# Patient Record
Sex: Male | Born: 1975 | Race: Black or African American | Hispanic: No | Marital: Married | Smoking: Never smoker
Health system: Southern US, Community
[De-identification: ages and names within clinical notes are randomized; demographics above are authoritative.]

---

## 2014-04-17 ENCOUNTER — Ambulatory Visit (INDEPENDENT_AMBULATORY_CARE_PROVIDER_SITE_OTHER): Payer: Self-pay | Admitting: Family Medicine

## 2014-04-17 VITALS — BP 121/78 | HR 68 | Temp 98.6°F | Resp 16 | Ht 67.0 in | Wt 212.8 lb

## 2014-04-17 DIAGNOSIS — Z0289 Encounter for other administrative examinations: Secondary | ICD-10-CM

## 2014-04-17 NOTE — Progress Notes (Signed)
Commercial Driver Medical Examination   Isaac Gutierrez. is a 38 y.o. male who presents today for a commercial driver fitness determination physical exam. The patient reports no problems. The following portions of the patient's history were reviewed and updated as appropriate: allergies, current medications, past family history, past medical history, past social history, past surgical history and problem list.  Review of Systems Pertinent items are noted in HPI.   Objective:    Vision:  Uncorrected Corrected Horizontal Field of Vision  Right Eye 20/20 na 85 degrees  Left Eye  20/20 na 85 degrees  Both Eyes  39/67 28/97    Applicant can recognize and distinguish among traffic control signals and devices showing standard red, green, and amber colors.     Monocular Vision?: No   Hearing:   500 Hz 1000 Hz 2000 Hz 4000 Hz  Right Ear  N/A N/A N/A N/A  Left Ear  N/A N/A N/A N/A   Can hear whispered voice 10 feet bilaterally.     BP 121/78  Pulse 68  Temp(Src) 98.6 F (37 C) (Oral)  Resp 16  Ht 5' 7"  (1.702 m)  Wt 212 lb 12.8 oz (96.525 kg)  BMI 33.32 kg/m2  SpO2 100%  General Appearance:    Alert, cooperative, no distress, appears stated age  Head:    Normocephalic, without obvious abnormality, atraumatic  Eyes:    PERRL, conjunctiva/corneas clear, EOM's intact, fundi    benign, both eyes       Ears:    Normal TM's and external ear canals, both ears  Nose:   Nares normal, septum midline, mucosa normal, no drainage    or sinus tenderness  Throat:   Lips, mucosa, and tongue normal; teeth and gums normal  Neck:   Supple, symmetrical, trachea midline, no adenopathy;       thyroid:  No enlargement/tenderness/nodules; no carotid   bruit or JVD  Back:     Symmetric, no curvature, ROM normal, no CVA tenderness  Lungs:     Clear to auscultation bilaterally, respirations unlabored  Chest wall:    No tenderness or deformity  Heart:    Regular rate and rhythm, S1 and S2  normal, no murmur, rub   or gallop  Abdomen:     Soft, non-tender, bowel sounds active all four quadrants,    no masses, no organomegaly  Genitalia:    Normal male without lesion or discharge  Extremities:   Extremities normal, atraumatic, no cyanosis or edema  Pulses:   2+ and symmetric all extremities  Skin:   Skin color, texture, turgor normal, no rashes or lesions  Lymph nodes:   Cervical, supraclavicular, and axillary nodes normal  Neurologic:   CNII-XII intact. Normal strength, sensation and reflexes      throughout    Labs: No results found for this basename: SPECGRAV, PROTEINUR, BILIRUBINUR, GLUCOSEU      Assessment:    Healthy male exam.  Meets standards in 7 CFR 391.41;  qualifies for 2 year certificate.    Plan:    Medical examiners certificate completed and printed. Return as needed.

## 2014-07-10 ENCOUNTER — Emergency Department (INDEPENDENT_AMBULATORY_CARE_PROVIDER_SITE_OTHER)
Admission: EM | Admit: 2014-07-10 | Discharge: 2014-07-10 | Disposition: A | Discharge status: Home / Self Care | Payer: Self-pay | Source: Home / Self Care | Attending: Family Medicine | Admitting: Family Medicine

## 2014-07-10 ENCOUNTER — Encounter (HOSPITAL_COMMUNITY): Payer: Self-pay | Admitting: Emergency Medicine

## 2014-07-10 DIAGNOSIS — R109 Unspecified abdominal pain: Secondary | ICD-10-CM

## 2014-07-10 NOTE — ED Provider Notes (Signed)
CSN: [id number]     Arrival date & time 07/10/14  1013 History   First MD Initiated Contact with Patient 07/10/14 1031     Chief Complaint  Patient presents with  . Pain   (Consider location/radiation/quality/duration/timing/severity/associated sxs/prior Treatment) HPI Flank pain: Symptoms started two days ago around 9:30pm. Pain located at left flank in a discrete, short, stripe pattern. Tylenol helps, last taken last night. Constant. Unchanged. Burning pain. Associated swelling. History of chicken pox mostly on abdomen and chest. Normal urination. No fever, nausea vomiting.  History reviewed. No pertinent past medical history. History reviewed. No pertinent past surgical history. Family History  Problem Relation Age of Onset  . Alcohol abuse Father   . Diabetes Sister    History  Substance Use Topics  . Smoking status: Never Smoker   . Smokeless tobacco: Not on file  . Alcohol Use: No    Review of Systems  Gastrointestinal: Negative for nausea, vomiting, diarrhea, constipation and blood in stool.  Genitourinary: Positive for flank pain. Negative for dysuria, frequency, hematuria and difficulty urinating.  Skin: Negative for rash.    Allergies  Review of patient's allergies indicates no known allergies.  Home Medications   Prior to Admission medications   Not on File   BP 106/68  Pulse 60  Temp(Src) 98.1 F (36.7 C) (Oral)  Resp 14  SpO2 100% Physical Exam  Constitutional: He is oriented to person, place, and time. He appears well-developed and well-nourished.  Eyes: Conjunctivae are normal.  Pulmonary/Chest: Effort normal.  Abdominal: Soft. Bowel sounds are normal. He exhibits no distension. There is no hepatosplenomegaly. There is no tenderness. There is no rebound and no CVA tenderness.  Neurological: He is alert and oriented to person, place, and time.  Skin: Skin is warm and dry. No rash noted. No erythema.    ED Course  Procedures (including critical care  time) Labs Review Labs Reviewed - No data to display  Imaging Review No results found.   MDM   1. Left flank pain    Possibly early shingles eruption. History and physical not consistent with diverticulitis vs nephrolithiasis vs constipation vs pyelonephritis vs muscle strain. Patient elects to not start antiviral therapy since symptoms are improving. Will continue Tylenol as needed for pain since this treatment is helping. Patient understands and agrees with plan. Stable for discharge home.    Cordelia Poche, MD 07/10/14 [phone]

## 2014-07-10 NOTE — ED Notes (Signed)
Pain on left side of torso/flank.  Not deep pain, reported as just below skin, constant improves with tylenol, but never goes away completely.  No urinary symptoms, last bm this am and normal.  No known injury, no radiation, no variation in skin appearance

## 2014-07-10 NOTE — ED Provider Notes (Signed)
Isaac Gutierrez. is a 38 y.o. male who presents to Urgent Care today for left flank pain. Patient is a 2 day history of burning tingling pain in a dermatomal pattern on his left flank. He denies any rash. No fevers or chills nausea vomiting or diarrhea. No urinary symptoms. He's tried Tylenol or chills. He notes a history of chickenpox as a child.   History reviewed. No pertinent past medical history. History  Substance Use Topics  . Smoking status: Never Smoker   . Smokeless tobacco: Not on file  . Alcohol Use: No   ROS as above Medications: No current facility-administered medications for this encounter.   No current outpatient prescriptions on file.    Exam:  BP 106/68  Pulse 60  Temp(Src) 98.1 F (36.7 C) (Oral)  Resp 14  SpO2 100% Gen: Well NAD HEENT: EOMI,  MMM Lungs: Normal work of breathing. CTABL Heart: RRR no MRG Abd: NABS, Soft. Nondistended, Nontender Exts: Brisk capillary refill, warm and well perfused.   skin: No rash  No results found for this or any previous visit (from the past 24 hour(s)). No results found.  Assessment and Plan: 38 y.o. male with probable early shingles. Discussed options with patient. Offered acyclovir. Patient states that he does not want to take this medication. We'll continue Tylenol for comfort as needed and watchful waiting.  Discussed warning signs or symptoms. Please see discharge instructions. Patient expresses understanding.     Gregor Hams, MD 07/10/14 1053

## 2014-07-10 NOTE — Discharge Instructions (Signed)
This pain may be related to a shingles infection. Please continue to use Tylenol as needed for pain.  Shingles Shingles (herpes zoster) is an infection that is caused by the same virus that causes chickenpox (varicella). The infection causes a painful skin rash and fluid-filled blisters, which eventually break open, crust over, and heal. It may occur in any area of the body, but it usually affects only one side of the body or face. The pain of shingles usually lasts about 1 month. However, some people with shingles may develop long-term (chronic) pain in the affected area of the body. Shingles often occurs many years after the person had chickenpox. It is more common:  In people older than 50 years.  In people with weakened immune systems, such as those with HIV, AIDS, or cancer.  In people taking medicines that weaken the immune system, such as transplant medicines.  In people under great stress. CAUSES  Shingles is caused by the varicella zoster virus (VZV), which also causes chickenpox. After a person is infected with the virus, it can remain in the person's body for years in an inactive state (dormant). To cause shingles, the virus reactivates and breaks out as an infection in a nerve root. The virus can be spread from person to person (contagious) through contact with open blisters of the shingles rash. It will only spread to people who have not had chickenpox. When these people are exposed to the virus, they may develop chickenpox. They will not develop shingles. Once the blisters scab over, the person is no longer contagious and cannot spread the virus to others. SIGNS AND SYMPTOMS  Shingles shows up in stages. The initial symptoms may be pain, itching, and tingling in an area of the skin. This pain is usually described as burning, stabbing, or throbbing.In a few days or weeks, a painful red rash will appear in the area where the pain, itching, and tingling were felt. The rash is usually on  one side of the body in a band or belt-like pattern. Then, the rash usually turns into fluid-filled blisters. They will scab over and dry up in approximately 2-3 weeks. Flu-like symptoms may also occur with the initial symptoms, the rash, or the blisters. These may include:  Fever.  Chills.  Headache.  Upset stomach. DIAGNOSIS  Your health care provider will perform a skin exam to diagnose shingles. Skin scrapings or fluid samples may also be taken from the blisters. This sample will be examined under a microscope or sent to a lab for further testing. TREATMENT  There is no specific cure for shingles. Your health care provider will likely prescribe medicines to help you manage the pain, recover faster, and avoid long-term problems. This may include antiviral drugs, anti-inflammatory drugs, and pain medicines. HOME CARE INSTRUCTIONS   Take a cool bath or apply cool compresses to the area of the rash or blisters as directed. This may help with the pain and itching.   Take medicines only as directed by your health care provider.   Rest as directed by your health care provider.  Keep your rash and blisters clean with mild soap and cool water or as directed by your health care provider.  Do not pick your blisters or scratch your rash. Apply an anti-itch cream or numbing creams to the affected area as directed by your health care provider.  Keep your shingles rash covered with a loose bandage (dressing).  Avoid skin contact with:  Babies.   Pregnant women.  Children with eczema.   Elderly people with transplants.   People with chronic illnesses, such as leukemia or AIDS.   Wear loose-fitting clothing to help ease the pain of material rubbing against the rash.  Keep all follow-up visits as directed by your health care provider.If the area involved is on your face, you may receive a referral for a specialist, such as an eye doctor (ophthalmologist) or an ear, nose, and  throat (ENT) doctor. Keeping all follow-up visits will help you avoid eye problems, chronic pain, or disability.  SEEK IMMEDIATE MEDICAL CARE IF:   You have facial pain, pain around the eye area, or loss of feeling on one side of your face.  You have ear pain or ringing in your ear.  You have loss of taste.  Your pain is not relieved with prescribed medicines.   Your redness or swelling spreads.   You have more pain and swelling.  Your condition is worsening or has changed.   You have a fever. MAKE SURE YOU:  Understand these instructions.  Will watch your condition.  Will get help right away if you are not doing well or get worse. Document Released: 10/03/2005 Document Revised: 02/17/2014 Document Reviewed: 05/17/2012 [facility] Patient Information 2015 [place], [place]. This information is not intended to replace advice given to you by your health care provider. Make sure you discuss any questions you have with your health care provider.

## 2014-07-12 NOTE — ED Provider Notes (Signed)
Medical screening examination/treatment/procedure(s) were performed by a resident physician or non-physician practitioner and as the supervising physician I was immediately available for consultation/collaboration.  Lynne Leader, MD    Gregor Hams, MD 07/12/14 [phone]

## 2020-10-21 ENCOUNTER — Emergency Department (HOSPITAL_COMMUNITY): Payer: Self-pay

## 2020-10-21 ENCOUNTER — Other Ambulatory Visit: Payer: Self-pay

## 2020-10-21 ENCOUNTER — Emergency Department (HOSPITAL_COMMUNITY)
Admission: EM | Admit: 2020-10-21 | Discharge: 2020-10-22 | Disposition: A | Discharge status: Home / Self Care | Payer: Self-pay | Attending: Emergency Medicine | Admitting: Emergency Medicine

## 2020-10-21 ENCOUNTER — Encounter (HOSPITAL_COMMUNITY): Payer: Self-pay | Admitting: Emergency Medicine

## 2020-10-21 DIAGNOSIS — G510 Bell's palsy: Secondary | ICD-10-CM | POA: Insufficient documentation

## 2020-10-21 LAB — DIFFERENTIAL
Abs Immature Granulocytes: 0.01 10*3/uL (ref 0.00–0.07)
Basophils Absolute: 0 10*3/uL (ref 0.0–0.1)
Basophils Relative: 1 %
Eosinophils Absolute: 0.1 10*3/uL (ref 0.0–0.5)
Eosinophils Relative: 2 %
Immature Granulocytes: 0 %
Lymphocytes Relative: 48 %
Lymphs Abs: 2.2 10*3/uL (ref 0.7–4.0)
Monocytes Absolute: 0.4 10*3/uL (ref 0.1–1.0)
Monocytes Relative: 9 %
Neutro Abs: 1.8 10*3/uL (ref 1.7–7.7)
Neutrophils Relative %: 40 %

## 2020-10-21 LAB — CBC
HCT: 46.2 % (ref 39.0–52.0)
Hemoglobin: 15 g/dL (ref 13.0–17.0)
MCH: 30.5 pg (ref 26.0–34.0)
MCHC: 32.5 g/dL (ref 30.0–36.0)
MCV: 93.9 fL (ref 80.0–100.0)
Platelets: 269 10*3/uL (ref 150–400)
RBC: 4.92 MIL/uL (ref 4.22–5.81)
RDW: 11.5 % (ref 11.5–15.5)
WBC: 4.5 10*3/uL (ref 4.0–10.5)
nRBC: 0 % (ref 0.0–0.2)

## 2020-10-21 LAB — I-STAT CHEM 8, ED
BUN: 14 mg/dL (ref 6–20)
Calcium, Ion: 1.19 mmol/L (ref 1.15–1.40)
Chloride: 101 mmol/L (ref 98–111)
Creatinine, Ser: 1 mg/dL (ref 0.61–1.24)
Glucose, Bld: 108 mg/dL — ABNORMAL HIGH (ref 70–99)
HCT: 46 % (ref 39.0–52.0)
Hemoglobin: 15.6 g/dL (ref 13.0–17.0)
Potassium: 4 mmol/L (ref 3.5–5.1)
Sodium: 141 mmol/L (ref 135–145)
TCO2: 29 mmol/L (ref 22–32)

## 2020-10-21 LAB — COMPREHENSIVE METABOLIC PANEL
ALT: 32 U/L (ref 0–44)
AST: 39 U/L (ref 15–41)
Albumin: 4.3 g/dL (ref 3.5–5.0)
Alkaline Phosphatase: 53 U/L (ref 38–126)
Anion gap: 10 (ref 5–15)
BUN: 12 mg/dL (ref 6–20)
CO2: 27 mmol/L (ref 22–32)
Calcium: 9.6 mg/dL (ref 8.9–10.3)
Chloride: 102 mmol/L (ref 98–111)
Creatinine, Ser: 1.1 mg/dL (ref 0.61–1.24)
GFR, Estimated: >60 mL/min (ref >60)
Glucose, Bld: 110 mg/dL — ABNORMAL HIGH (ref 70–99)
Potassium: 3.9 mmol/L (ref 3.5–5.1)
Sodium: 139 mmol/L (ref 135–145)
Total Bilirubin: 1.7 mg/dL — ABNORMAL HIGH (ref 0.3–1.2)
Total Protein: 7.9 g/dL (ref 6.5–8.1)

## 2020-10-21 LAB — PROTIME-INR
INR: 1 (ref 0.8–1.2)
Prothrombin Time: 12.7 seconds (ref 11.4–15.2)

## 2020-10-21 LAB — APTT: aPTT: 39 seconds — ABNORMAL HIGH (ref 24–36)

## 2020-10-21 LAB — CBG MONITORING, ED: Glucose-Capillary: 105 mg/dL — ABNORMAL HIGH (ref 70–99)

## 2020-10-21 MED ORDER — SODIUM CHLORIDE 0.9% FLUSH: 3.0000 mL | Freq: Once | INTRAVENOUS | Status: DC

## 2020-10-21 NOTE — ED Triage Notes (Signed)
Pt arrives to ED with a c/c of left sided facial droop and a sensation of having no control over the left side of his face since yesterday afternoon. He did have bell palsy years ago that presented similar. Pt has no other weakness.

## 2020-10-22 MED ORDER — VALACYCLOVIR HCL 1 G PO TABS: 1000.0000 mg | ORAL_TABLET | Freq: Three times a day (TID) | ORAL | 0 refills | Status: AC

## 2020-10-22 MED ORDER — PREDNISONE 20 MG PO TABS: 60.0000 mg | ORAL_TABLET | Freq: Every day | ORAL | 0 refills | Status: AC

## 2020-10-22 NOTE — ED Provider Notes (Signed)
[facility] Emergency Department Provider Note MRN:  [id number]  Arrival date & time: 10/22/20     Chief Complaint   Facial Droop   History of Present Illness   Isaac Gutierrez. is a 45 y.o. year-old male with history of Bell's palsy presenting to the ED with chief complaint of facial weakness.  Facial weakness on the left leg began Tuesday evening at 7 PM.  First seem to involve the periorbital region and then spread to the lower part of the left side of the face.  Denies any numbness or weakness to the arms or legs, no speech disturbance, no headache or vision change, no other complaints.  Review of Systems  A complete 10 system review of systems was obtained and all systems are negative except as noted in the HPI and PMH.   Patient's Health History   History reviewed. No pertinent past medical history.  No past surgical history on file.  Family History  Problem Relation Age of Onset  . Alcohol abuse Father   . Diabetes Sister     Social History   Socioeconomic History  . Marital status: Married    Spouse name: Not on file  . Number of children: Not on file  . Years of education: Not on file  . Highest education level: Not on file  Occupational History  . Not on file  Tobacco Use  . Smoking status: Never Smoker  . Smokeless tobacco: Not on file  Substance and Sexual Activity  . Alcohol use: No  . Drug use: No  . Sexual activity: Not on file  Other Topics Concern  . Not on file  Social History Narrative  . Not on file   Social Determinants of Health   Financial Resource Strain: Not on file  Food Insecurity: Not on file  Transportation Needs: Not on file  Physical Activity: Not on file  Stress: Not on file  Social Connections: Not on file  Intimate Partner Violence: Not on file     Physical Exam   Vitals:   10/22/20 0058 10/22/20 0347  BP: 126/77 114/64  Pulse: 78 80  Resp: 18 16  Temp: 98.6 F (37 C)   SpO2: 98% 98%     CONSTITUTIONAL: Well-appearing, NAD NEURO:  Alert and oriented x 3, left-sided facial paralysis involving the periorbital region and forehead EYES:  eyes equal and reactive ENT/NECK:  no LAD, no JVD CARDIO: Regular rate, well-perfused, normal S1 and S2 PULM:  CTAB no wheezing or rhonchi GI/GU:  normal bowel sounds, non-distended, non-tender MSK/SPINE:  No gross deformities, no edema SKIN:  no rash, atraumatic PSYCH:  Appropriate speech and behavior  *Additional and/or pertinent findings included in MDM below  Diagnostic and Interventional Summary    EKG Interpretation  Date/Time:    Ventricular Rate:    PR Interval:    QRS Duration:   QT Interval:    QTC Calculation:   R Axis:     Text Interpretation:        Labs Reviewed  APTT - Abnormal; Notable for the following components:      Result Value   aPTT 39 (*)    All other components within normal limits  COMPREHENSIVE METABOLIC PANEL - Abnormal; Notable for the following components:   Glucose, Bld 110 (*)    Total Bilirubin 1.7 (*)    All other components within normal limits  I-STAT CHEM 8, ED - Abnormal; Notable for the following components:   Glucose, Bld  108 (*)    All other components within normal limits  CBG MONITORING, ED - Abnormal; Notable for the following components:   Glucose-Capillary 105 (*)    All other components within normal limits  PROTIME-INR  CBC  DIFFERENTIAL    CT HEAD WO CONTRAST  Final Result      Medications  sodium chloride flush (NS) 0.9 % injection 3 mL (has no administration in time range)     Procedures  /  Critical Care Procedures  ED Course and Medical Decision Making  I have reviewed the triage vital signs, the nursing notes, and pertinent available records from the EMR.  Listed above are laboratory and imaging tests that I personally ordered, reviewed, and interpreted and then considered in my medical decision making (see below for details).  Exam consistent with  Bell's palsy, otherwise reassuring neurological exam.  Labs and CT ordered in triage are normal.  Patient is appropriate for discharge.       Isaac Gutierrez, [place] [email or web address]  Final Clinical Impressions(s) / ED Diagnoses     ICD-10-CM   1. Bell's palsy  G51.0     ED Discharge Orders         Ordered    predniSONE (DELTASONE) 20 MG tablet  Daily        10/22/20 0355    valACYclovir (VALTREX) 1000 MG tablet  3 times daily        10/22/20 0355           Discharge Instructions Discussed with and Provided to Patient:     Discharge Instructions     You were evaluated in the Emergency Department and after careful evaluation, we did not find any emergent condition requiring admission or further testing in the hospital.  Your exam/testing today is overall reassuring.  Symptoms seem to be due to Bell's palsy.  Please take prednisone and valacyclovir medications as directed, use an eye patch at night and eyedrops during the day to help protect the eye and keep it lubricated.  Please return to the Emergency Department if you experience any worsening of your condition.   Thank you for allowing [place] to be a part of your care.      Maudie Flakes, MD 10/22/20 [phone]

## 2020-10-22 NOTE — Discharge Instructions (Signed)
You were evaluated in the Emergency Department and after careful evaluation, we did not find any emergent condition requiring admission or further testing in the hospital.  Your exam/testing today is overall reassuring.  Symptoms seem to be due to Bell's palsy.  Please take prednisone and valacyclovir medications as directed, use an eye patch at night and eyedrops during the day to help protect the eye and keep it lubricated.  Please return to the Emergency Department if you experience any worsening of your condition.   Thank you for allowing [place] to be a part of your care.

## 2021-05-22 IMAGING — CT CT HEAD W/O CM
4 series · 17 of 47 positions shown, 19 images · non-contrast
Comparison: None.

CLINICAL DATA: Left-sided facial droop

EXAM:
CT HEAD WITHOUT CONTRAST
TECHNIQUE: Contiguous axial images were obtained from the base of the skull
through the vertex without intravenous contrast.

[Series 3: head wo · axial · 0.46mm/px · z∈[-135,-25]mm · 7 of 30 slices shown, 9 images]
[im 4/30  brain]
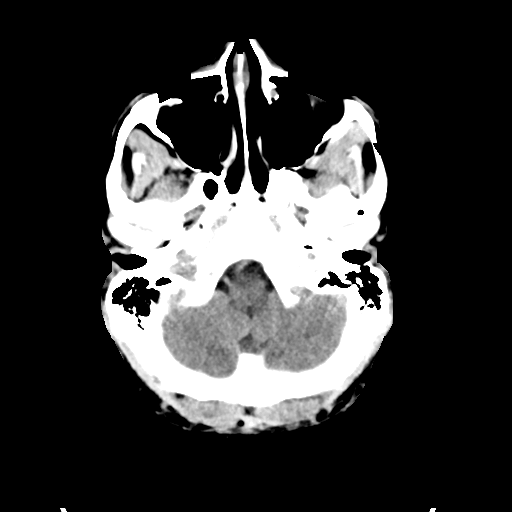
[im 4/30  bone]
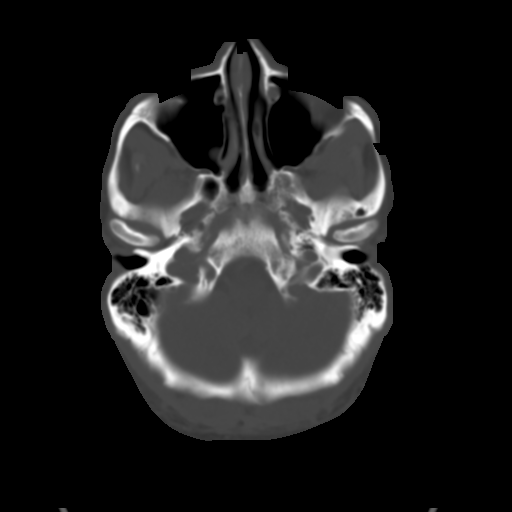
[im 8/30  brain]
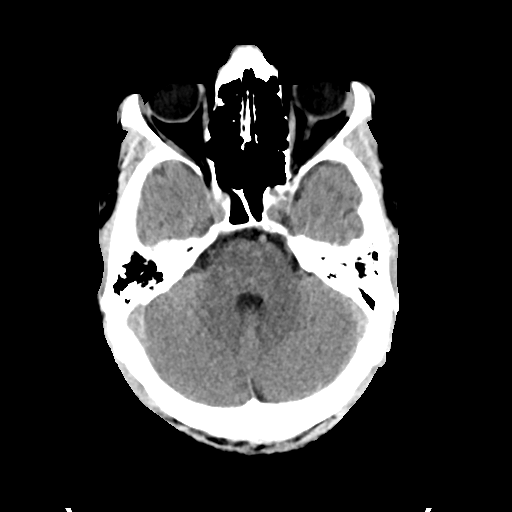
[im 11/30  brain]
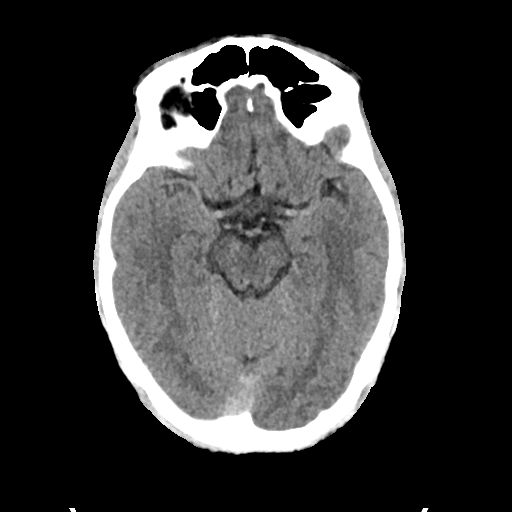
[im 15/30  brain]
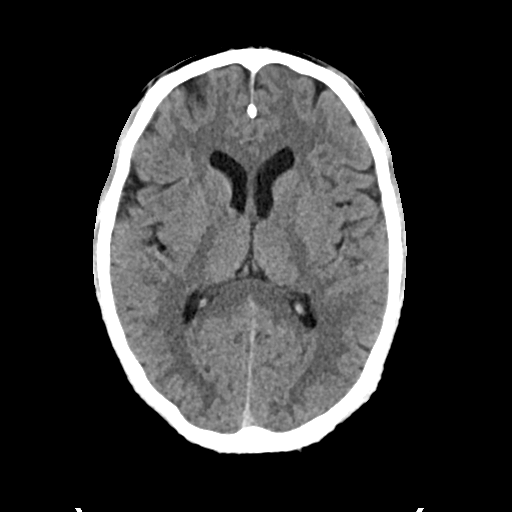
[im 19/30  brain]
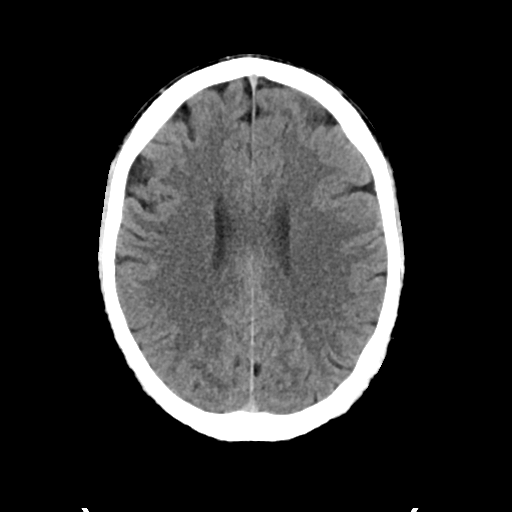
[im 19/30  bone]
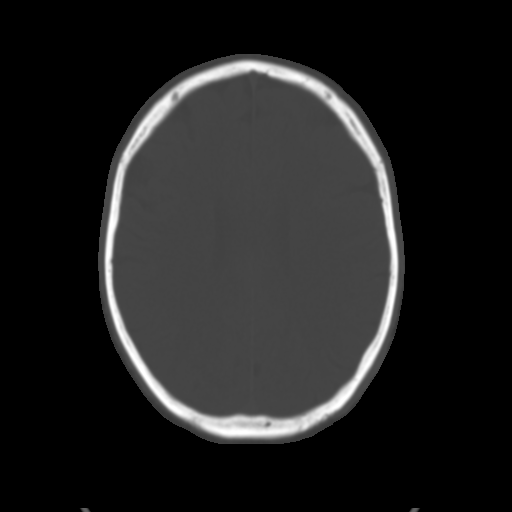
[im 22/30  brain]
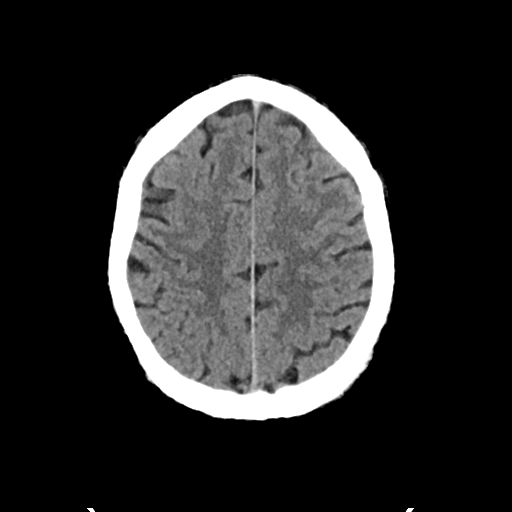
[im 26/30  brain]
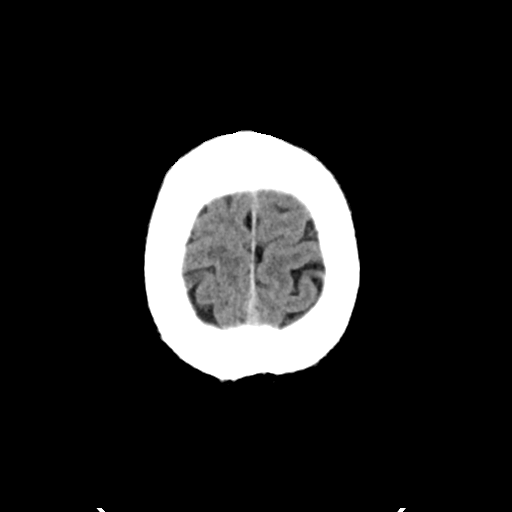

[Series 4: head bone · axial · 0.46mm/px · z∈[-136,-86]mm · 4 of 74 slices shown]
[im 8/74  bone]
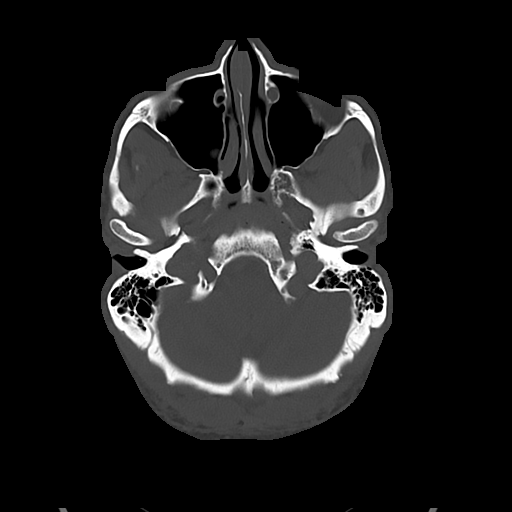
[im 15/74  bone]
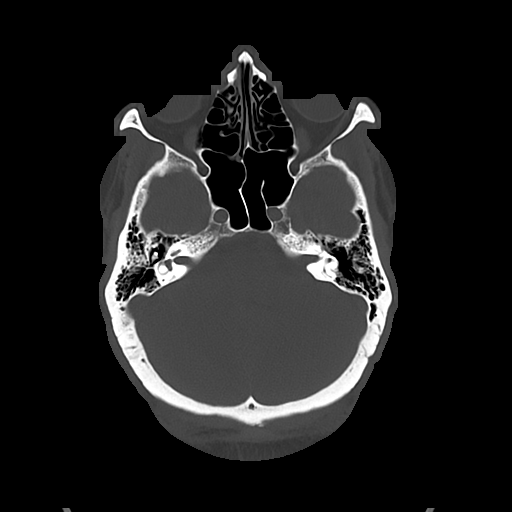
[im 22/74  bone]
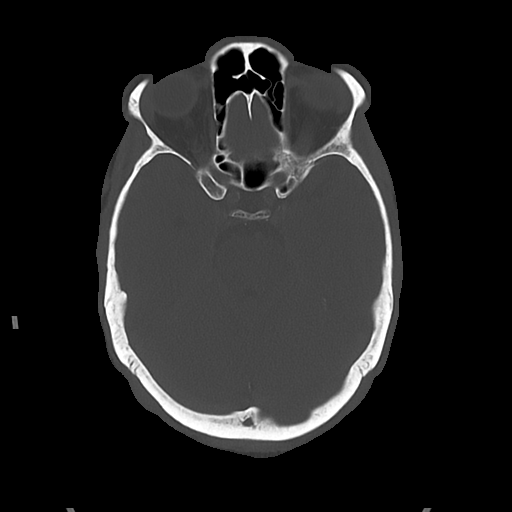
[im 33/74  bone]
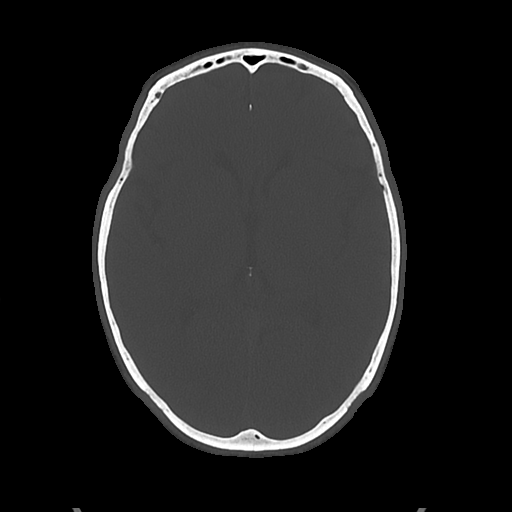

[Series 5: cor soft · coronal · 0.33mm/px · 3 of 71 slices shown]
[im 24/71  brain]
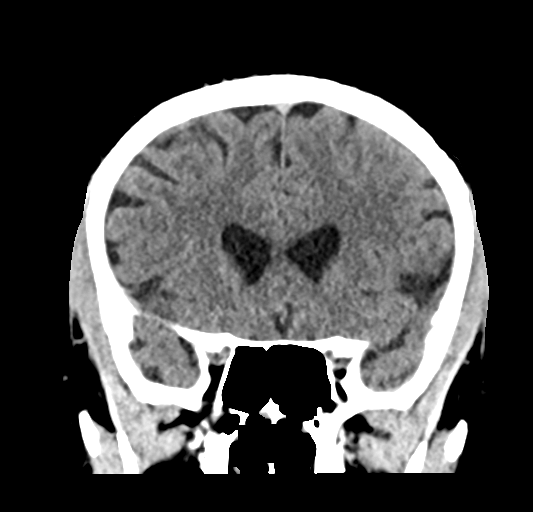
[im 32/71  brain]
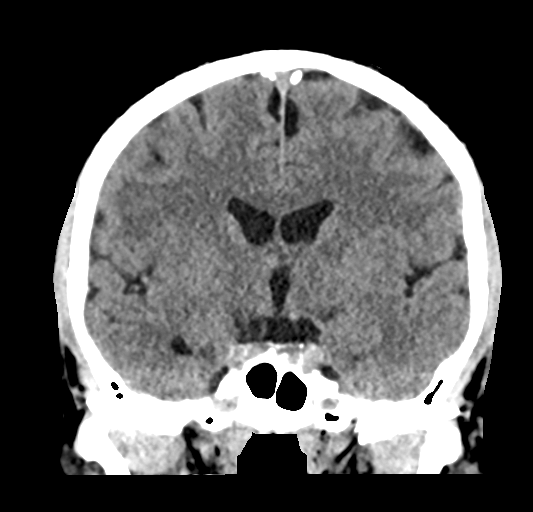
[im 39/71  brain]
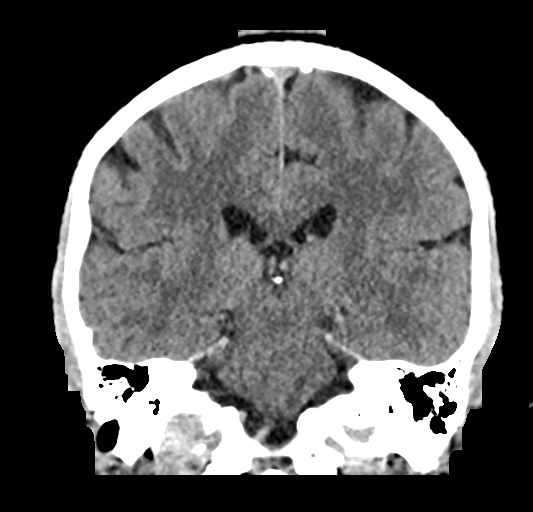

[Series 6: sag soft · sagittal · 0.33mm/px · 3 of 55 slices shown]
[im 19/55  brain]
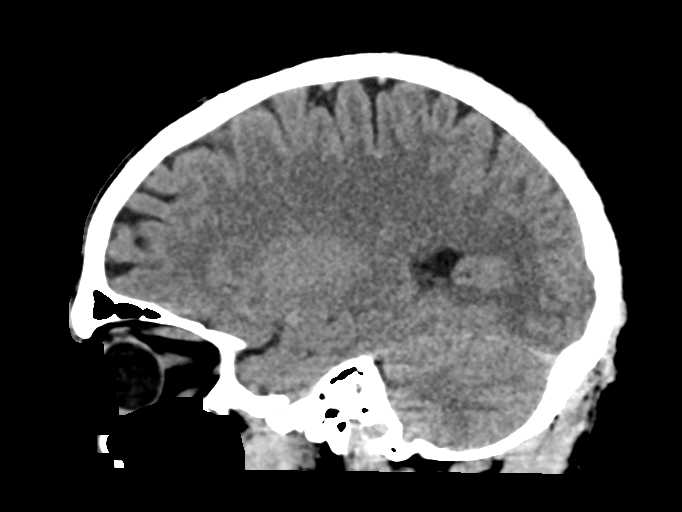
[im 28/55  brain]
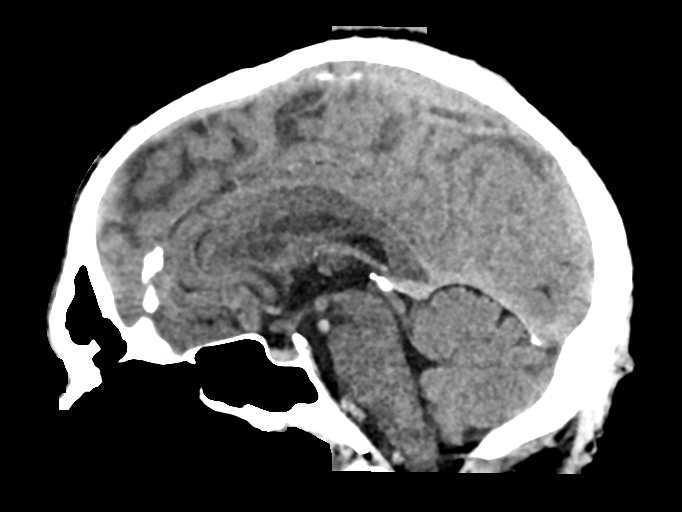
[im 37/55  brain]
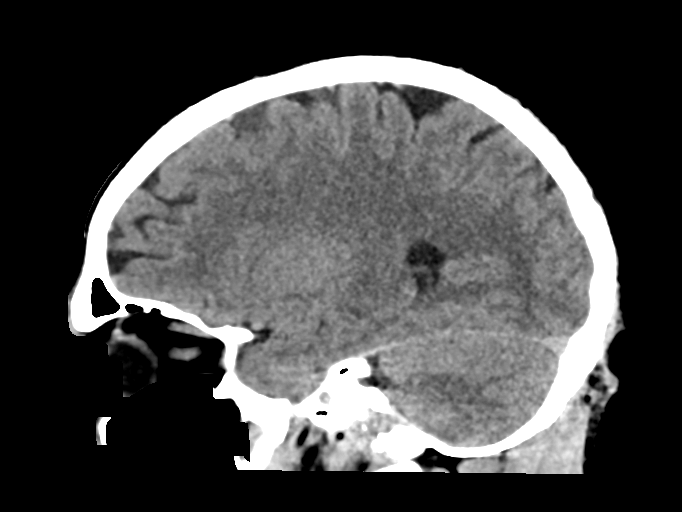

[17 of 47 positions shown; findings below may reference images not displayed]

FINDINGS: Brain: No evidence of acute infarction, hemorrhage, hydrocephalus,
extra-axial collection or mass lesion/mass effect.

Vascular: No hyperdense vessel or unexpected calcification.

Skull: Normal. Negative for fracture or focal lesion.

Sinuses/Orbits: No acute finding.

Other: None.
IMPRESSION: No acute intracranial abnormality noted.

## 2021-08-17 DIAGNOSIS — M25512 Pain in left shoulder: Secondary | ICD-10-CM | POA: Insufficient documentation

## 2021-12-16 DIAGNOSIS — M79646 Pain in unspecified finger(s): Secondary | ICD-10-CM | POA: Insufficient documentation

## 2021-12-24 ENCOUNTER — Other Ambulatory Visit: Payer: Self-pay

## 2021-12-24 ENCOUNTER — Encounter: Payer: Self-pay | Admitting: Family Medicine

## 2021-12-24 ENCOUNTER — Ambulatory Visit (INDEPENDENT_AMBULATORY_CARE_PROVIDER_SITE_OTHER): Payer: Managed Care, Other (non HMO) | Admitting: Family Medicine

## 2021-12-24 VITALS — BP 122/80 | HR 64 | Temp 98.1°F | Resp 16 | Ht 67.0 in | Wt 191.6 lb

## 2021-12-24 DIAGNOSIS — M25512 Pain in left shoulder: Secondary | ICD-10-CM

## 2021-12-24 DIAGNOSIS — M79645 Pain in left finger(s): Secondary | ICD-10-CM

## 2021-12-24 DIAGNOSIS — Z7689 Persons encountering health services in other specified circumstances: Secondary | ICD-10-CM

## 2021-12-24 NOTE — Progress Notes (Signed)
Patient is here to establish care. Patient c/o left shoulder pain and left finger pain. Patient recently was on prednisone and had a cortisone injection for his issues. Per patient the treatment did not work. ?

## 2021-12-24 NOTE — Progress Notes (Signed)
? ?  New Patient Office Visit ? ?Subjective:  ?Patient ID: Isaac Gutierrez., male    DOB: 1976/03/10  Age: 46 y.o. MRN: [id number] ? ?CC:  ?Chief Complaint  ?Patient presents with  ? Establish Care  ? ? ?HPI ?Isaac Gutierrez. presents for to establish. Patient complains of left shoulder and finger pain that he has recently seen ortho for. Patient denies known trauma or injury.  ? ?History reviewed. No pertinent past medical history. ? ?History reviewed. No pertinent surgical history. ? ?Family History  ?Problem Relation Age of Onset  ? Alcohol abuse Father   ? Diabetes Sister   ? ? ?Social History  ? ?Socioeconomic History  ? Marital status: Married  ?  Spouse name: Not on file  ? Number of children: Not on file  ? Years of education: Not on file  ? Highest education level: Not on file  ?Occupational History  ? Not on file  ?Tobacco Use  ? Smoking status: Never  ? Smokeless tobacco: Not on file  ?Vaping Use  ? Vaping Use: Never used  ?Substance and Sexual Activity  ? Alcohol use: No  ? Drug use: No  ? Sexual activity: Yes  ?Other Topics Concern  ? Not on file  ?Social History Narrative  ? Not on file  ? ?Social Determinants of Health  ? ?Financial Resource Strain: Not on file  ?Food Insecurity: Not on file  ?Transportation Needs: Not on file  ?Physical Activity: Not on file  ?Stress: Not on file  ?Social Connections: Not on file  ?Intimate Partner Violence: Not on file  ? ? ?ROS ?Review of Systems  ?All other systems reviewed and are negative. ? ?Objective:  ? ?Today's Vitals: BP 122/80   Pulse 64   Temp 98.1 ?F (36.7 ?C) (Oral)   Resp 16   Ht 5' 7"  (1.702 m)   Wt 191 lb 9.6 oz (86.9 kg)   SpO2 95%   BMI 30.01 kg/m?  ? ?Physical Exam ?Vitals and nursing note reviewed.  ?Constitutional:   ?   General: He is not in acute distress. ?Cardiovascular:  ?   Rate and Rhythm: Normal rate and regular rhythm.  ?Pulmonary:  ?   Effort: Pulmonary effort is normal.  ?   Breath sounds: Normal breath sounds.   ?Musculoskeletal:  ?   Left shoulder: Tenderness present. No swelling or deformity. Decreased range of motion.  ?   Left hand: Tenderness present. No swelling or deformity. Decreased range of motion.  ?Neurological:  ?   General: No focal deficit present.  ?   Mental Status: He is alert and oriented to person, place, and time.  ? ? ?Assessment & Plan:  ? ?1. Pain of left shoulder joint on movement ?Referral to PT for further eval/mgt ? ?2. Pain of finger of left hand ?As above ? ?3. Encounter to establish care ? ? ? ?No outpatient encounter medications on file as of 12/24/2021.  ? ?No facility-administered encounter medications on file as of 12/24/2021.  ? ? ?Follow-up: No follow-ups on file.  ? ?Becky Sax, MD ? ?

## 2022-01-02 NOTE — Therapy (Deleted)
?OUTPATIENT PHYSICAL THERAPY SHOULDER EVALUATION ? ? ?Patient Name: Isaac Gutierrez. ?MRN: [id number] ?DOB:[birth year], 46 y.o., male ?Today's Date: 01/02/2022 ? ? ? ?No past medical history on file. ?No past surgical history on file. ?Patient Active Problem List  ? Diagnosis Date Noted  ? Pain in finger 12/16/2021  ? Pain of left shoulder joint on movement 08/17/2021  ? ? ?PCP: Dorna Mai, MD ? ?REFERRING PROVIDER: Dorna Mai, MD ? ?REFERRING DIAG: M25.512 (ICD-10-CM) - Pain of left shoulder joint on movement M79.645 (ICD-10-CM) - Pain of finger of left hand  ? ?THERAPY DIAG: 1. Pain of left shoulder joint on movement ?Referral to PT for further eval/mgt ?  ?2. Pain of finger of left hand ?As above ?  ? ? ? ?ONSET DATE: 12/24/2021  ? ?SUBJECTIVE:                                                                                                                                                                                     ? ?SUBJECTIVE STATEMENT: ?*** ? ?PERTINENT HISTORY: ?Patient is here to establish care. Patient c/o left shoulder pain and left finger pain. Patient recently was on prednisone and had a cortisone injection for his issues. Per patient the treatment did not work.  ? ?PAIN:  ?Are you having pain? {OPRCPAIN:27236} ? ?PRECAUTIONS: None ? ?WEIGHT BEARING RESTRICTIONS No ? ?FALLS:  ?Has patient fallen in last 6 months? {yes/no:20286} Number of falls: *** ? ?LIVING ENVIRONMENT: ?Lives with: {OPRC lives with:25569::"lives with their family"} ?Lives in: {Lives in:25570} ?Stairs: {yes/no:20286}; {Stairs:24000} ?Has following equipment at home: {Assistive devices:23999} ? ?OCCUPATION: ?*** ? ?PLOF: Independent ? ?PATIENT GOALS *** ? ?OBJECTIVE:  ? ?DIAGNOSTIC FINDINGS:  ?None noted ? ?PATIENT SURVEYS:  ?{rehab surveys:24030:a} ? ?COGNITION: ? Overall cognitive status: Within functional limits for tasks assessed ?    ?SENSATION: ?{sensation:27233} ? ?POSTURE: ?*** ? ?UPPER EXTREMITY ROM:   ? ?{AROM/PROM:27142} ROM Right ?01/02/2022 Left ?01/02/2022  ?Shoulder flexion    ?Shoulder extension    ?Shoulder abduction    ?Shoulder adduction    ?Shoulder internal rotation    ?Shoulder external rotation    ?Elbow flexion    ?Elbow extension    ?Wrist flexion    ?Wrist extension    ?Wrist ulnar deviation    ?Wrist radial deviation    ?Wrist pronation    ?Wrist supination    ?(Blank rows = not tested) ? ?UPPER EXTREMITY MMT: ? ?MMT Right ?01/02/2022 Left ?01/02/2022  ?Shoulder flexion    ?Shoulder extension    ?Shoulder abduction    ?Shoulder adduction    ?Shoulder internal rotation    ?Shoulder external rotation    ?Middle trapezius    ?  Lower trapezius    ?Elbow flexion    ?Elbow extension    ?Wrist flexion    ?Wrist extension    ?Wrist ulnar deviation    ?Wrist radial deviation    ?Wrist pronation    ?Wrist supination    ?Grip strength (lbs)    ?(Blank rows = not tested) ? ?SHOULDER SPECIAL TESTS: ? Impingement tests: {shoulder impingement test:25231:a} ? SLAP lesions: {SLAP lesions:25232} ? Instability tests: {shoulder instability test:25233} ? Rotator cuff assessment: {rotator cuff assessment:25234} ? Biceps assessment: {biceps assessment:25235} ? ?JOINT MOBILITY TESTING:  ?*** ? ?PALPATION:  ?*** ?  ?TODAY'S TREATMENT:  ?*** ? ? ?PATIENT EDUCATION: ?Education details: *** ?Person educated: {Person educated:25204} ?Education method: {Education Method:25205} ?Education comprehension: {Education Comprehension:25206} ? ? ?HOME EXERCISE PROGRAM: ?*** ? ?ASSESSMENT: ? ?CLINICAL IMPRESSION: ?Patient is a *** y.o. *** who was seen today for physical therapy evaluation and treatment for ***.  ? ? ?OBJECTIVE IMPAIRMENTS {opptimpairments:25111}.  ? ?ACTIVITY LIMITATIONS {activity limitations:25113}.  ? ?PERSONAL FACTORS {Personal factors:25162} are also affecting patient's functional outcome.  ? ? ?REHAB POTENTIAL: {rehabpotential:25112} ? ?CLINICAL DECISION MAKING: {clinical decision making:25114} ? ?EVALUATION  COMPLEXITY: {Evaluation complexity:25115} ? ? ?GOALS: ?Goals reviewed with patient? {yes/no:20286} ? ?SHORT TERM GOALS: Target date: {follow up:25551} ? ?*** ?Baseline: *** ?Goal status: {GOALSTATUS:25110} ? ?2.  *** ?Baseline: *** ?Goal status: {GOALSTATUS:25110} ? ?3.  *** ?Baseline: *** ?Goal status: {GOALSTATUS:25110} ? ?4.  *** ?Baseline: *** ?Goal status: {GOALSTATUS:25110} ? ?5.  *** ?Baseline: *** ?Goal status: {GOALSTATUS:25110} ? ?6.  *** ?Baseline: *** ?Goal status: {GOALSTATUS:25110} ? ?LONG TERM GOALS: Target date: {follow up:25551} ? ?*** ?Baseline: *** ?Goal status: {GOALSTATUS:25110} ? ?2.  *** ?Baseline: *** ?Goal status: {GOALSTATUS:25110} ? ?3.  *** ?Baseline: *** ?Goal status: {GOALSTATUS:25110} ? ?4.  *** ?Baseline: *** ?Goal status: {GOALSTATUS:25110} ? ?5.  *** ?Baseline: *** ?Goal status: {GOALSTATUS:25110} ? ?6.  *** ?Baseline: *** ?Goal status: {GOALSTATUS:25110} ? ? ?PLAN: ?PT FREQUENCY: {rehab frequency:25116} ? ?PT DURATION: {rehab duration:25117} ? ?PLANNED INTERVENTIONS: {rehab planned interventions:25118::"Therapeutic exercises","Therapeutic activity","Neuromuscular re-education","Balance training","Gait training","Patient/Family education","Joint mobilization"} ? ?PLAN FOR NEXT SESSION: *** ? ? ?Lanice Shirts, PT ?01/02/2022, 3:53 PM  ?

## 2022-01-03 ENCOUNTER — Ambulatory Visit: Payer: 59

## 2022-01-03 ENCOUNTER — Other Ambulatory Visit: Payer: Self-pay

## 2022-02-04 ENCOUNTER — Encounter: Payer: 59 | Admitting: Family Medicine

## 2022-03-10 ENCOUNTER — Encounter: Payer: Self-pay | Admitting: Family Medicine

## 2022-03-10 ENCOUNTER — Ambulatory Visit (INDEPENDENT_AMBULATORY_CARE_PROVIDER_SITE_OTHER): Payer: Managed Care, Other (non HMO) | Admitting: Family Medicine

## 2022-03-10 VITALS — BP 118/75 | HR 70 | Temp 98.1°F | Resp 16 | Ht 67.0 in | Wt 196.2 lb

## 2022-03-10 DIAGNOSIS — Z0000 Encounter for general adult medical examination without abnormal findings: Secondary | ICD-10-CM | POA: Diagnosis not present

## 2022-03-10 DIAGNOSIS — Z13220 Encounter for screening for lipoid disorders: Secondary | ICD-10-CM

## 2022-03-10 DIAGNOSIS — Z130 Encounter for screening for diseases of the blood and blood-forming organs and certain disorders involving the immune mechanism: Secondary | ICD-10-CM

## 2022-03-10 DIAGNOSIS — Z114 Encounter for screening for human immunodeficiency virus [HIV]: Secondary | ICD-10-CM

## 2022-03-10 DIAGNOSIS — Z1211 Encounter for screening for malignant neoplasm of colon: Secondary | ICD-10-CM

## 2022-03-10 DIAGNOSIS — Z1159 Encounter for screening for other viral diseases: Secondary | ICD-10-CM

## 2022-03-10 NOTE — Progress Notes (Signed)
Established Patient Office Visit  Subjective    Patient ID: Isaac Bastin., male    DOB: 1976-07-23  Age: 46 y.o. MRN: [id number]  CC:  Chief Complaint  Patient presents with   Annual Exam    HPI Isaac Gutierrez. presents for routine annual exam. Patient denies acute complaints or concerns.    No outpatient encounter medications on file as of 03/10/2022.   No facility-administered encounter medications on file as of 03/10/2022.    No past medical history on file.  No past surgical history on file.  Family History  Problem Relation Age of Onset   Alcohol abuse Father    Diabetes Sister     Social History   Socioeconomic History   Marital status: Married    Spouse name: Not on file   Number of children: Not on file   Years of education: Not on file   Highest education level: Not on file  Occupational History   Not on file  Tobacco Use   Smoking status: Never   Smokeless tobacco: Not on file  Vaping Use   Vaping Use: Never used  Substance and Sexual Activity   Alcohol use: No   Drug use: No   Sexual activity: Yes  Other Topics Concern   Not on file  Social History Narrative   Not on file   Social Determinants of Health   Financial Resource Strain: Not on file  Food Insecurity: Not on file  Transportation Needs: Not on file  Physical Activity: Not on file  Stress: Not on file  Social Connections: Not on file  Intimate Partner Violence: Not on file    Review of Systems  All other systems reviewed and are negative.      Objective    BP 118/75   Pulse 70   Temp 98.1 F (36.7 C) (Oral)   Resp 16   Ht 5' 7"  (1.702 m)   Wt 196 lb 3.2 oz (89 kg)   SpO2 95%   BMI 30.73 kg/m   Physical Exam Vitals and nursing note reviewed.  Constitutional:      General: He is not in acute distress. HENT:     Head: Normocephalic and atraumatic.     Right Ear: Tympanic membrane, ear canal and external ear normal.     Left Ear: Tympanic  membrane, ear canal and external ear normal.     Nose: Nose normal.     Mouth/Throat:     Mouth: Mucous membranes are moist.     Pharynx: Oropharynx is clear.  Eyes:     Conjunctiva/sclera: Conjunctivae normal.     Pupils: Pupils are equal, round, and reactive to light.  Neck:     Thyroid: No thyromegaly.  Cardiovascular:     Rate and Rhythm: Normal rate and regular rhythm.     Heart sounds: Normal heart sounds. No murmur heard. Pulmonary:     Effort: Pulmonary effort is normal.     Breath sounds: Normal breath sounds.  Abdominal:     General: There is no distension.     Palpations: Abdomen is soft. There is no mass.     Tenderness: There is no abdominal tenderness.     Hernia: There is no hernia in the left inguinal area or right inguinal area.  Genitourinary:    Penis: Normal and circumcised.      Testes: Normal.  Musculoskeletal:        General: Normal range of motion.  Cervical back: Normal range of motion and neck supple.     Right lower leg: No edema.     Left lower leg: No edema.  Skin:    General: Skin is warm and dry.  Neurological:     General: No focal deficit present.     Mental Status: He is alert and oriented to person, place, and time. Mental status is at baseline.  Psychiatric:        Mood and Affect: Mood normal.        Behavior: Behavior normal.        Assessment & Plan:   1. Annual physical exam Routine labs ordered - CMP14+EGFR - PSA  2 Screening for deficiency anemia  - CBC with Differential  3. Screening for lipid disorders  - Lipid Panel  4. Screening for endocrine/metabolic/immunity disorders  - Hemoglobin A1c - TSH  5. Screening for colon cancer  - Cologuard  6. Screening for HIV (human immunodeficiency virus)  - HIV antibody (with reflex)  7. Need for hepatitis C screening test  - Hepatitis C Antibody    No follow-ups on file.   Becky Sax, MD

## 2022-03-11 LAB — CMP14+EGFR
ALT: 23 IU/L (ref 0–44)
AST: 28 IU/L (ref 0–40)
Albumin/Globulin Ratio: 1.6 (ref 1.2–2.2)
Albumin: 4.6 g/dL (ref 4.0–5.0)
Alkaline Phosphatase: 57 IU/L (ref 44–121)
BUN/Creatinine Ratio: 13 (ref 9–20)
BUN: 13 mg/dL (ref 6–24)
Bilirubin Total: 0.5 mg/dL (ref 0.0–1.2)
CO2: 23 mmol/L (ref 20–29)
Calcium: 9.6 mg/dL (ref 8.7–10.2)
Chloride: 102 mmol/L (ref 96–106)
Creatinine, Ser: 1.02 mg/dL (ref 0.76–1.27)
Globulin, Total: 2.9 g/dL (ref 1.5–4.5)
Glucose: 77 mg/dL (ref 70–99)
Potassium: 4.2 mmol/L (ref 3.5–5.2)
Sodium: 141 mmol/L (ref 134–144)
Total Protein: 7.5 g/dL (ref 6.0–8.5)
eGFR: 91 mL/min/{1.73_m2} (ref >59)

## 2022-03-11 LAB — CBC WITH DIFFERENTIAL/PLATELET
Basophils Absolute: 0 10*3/uL (ref 0.0–0.2)
Basos: 1 %
EOS (ABSOLUTE): 0.1 10*3/uL (ref 0.0–0.4)
Eos: 2 %
Hematocrit: 44.5 % (ref 37.5–51.0)
Hemoglobin: 15.1 g/dL (ref 13.0–17.7)
Immature Grans (Abs): 0 10*3/uL (ref 0.0–0.1)
Immature Granulocytes: 0 %
Lymphocytes Absolute: 2.4 10*3/uL (ref 0.7–3.1)
Lymphs: 49 %
MCH: 31.7 pg (ref 26.6–33.0)
MCHC: 33.9 g/dL (ref 31.5–35.7)
MCV: 94 fL (ref 79–97)
Monocytes Absolute: 0.4 10*3/uL (ref 0.1–0.9)
Monocytes: 7 %
Neutrophils Absolute: 2 10*3/uL (ref 1.4–7.0)
Neutrophils: 41 %
Platelets: 252 10*3/uL (ref 150–450)
RBC: 4.76 x10E6/uL (ref 4.14–5.80)
RDW: 11.5 % — ABNORMAL LOW (ref 11.6–15.4)
WBC: 4.9 10*3/uL (ref 3.4–10.8)

## 2022-03-11 LAB — TSH: TSH: 1.64 u[IU]/mL (ref 0.450–4.500)

## 2022-03-11 LAB — LIPID PANEL
Chol/HDL Ratio: 4.2 ratio (ref 0.0–5.0)
Cholesterol, Total: 225 mg/dL — ABNORMAL HIGH (ref 100–199)
HDL: 53 mg/dL (ref >39)
LDL Chol Calc (NIH): 159 mg/dL — ABNORMAL HIGH (ref 0–99)
Triglycerides: 76 mg/dL (ref 0–149)
VLDL Cholesterol Cal: 13 mg/dL (ref 5–40)

## 2022-03-11 LAB — HIV ANTIBODY (ROUTINE TESTING W REFLEX): HIV Screen 4th Generation wRfx: NONREACTIVE

## 2022-03-11 LAB — PSA: Prostate Specific Ag, Serum: 0.2 ng/mL (ref 0.0–4.0)

## 2022-03-11 LAB — HEPATITIS C ANTIBODY: Hep C Virus Ab: NONREACTIVE

## 2022-03-11 LAB — HEMOGLOBIN A1C
Est. average glucose Bld gHb Est-mCnc: 100 mg/dL
Hgb A1c MFr Bld: 5.1 % (ref 4.8–5.6)

## 2022-03-18 ENCOUNTER — Encounter: Payer: Self-pay | Admitting: Family Medicine

## 2022-03-22 ENCOUNTER — Other Ambulatory Visit: Payer: Self-pay

## 2022-03-22 DIAGNOSIS — R7989 Other specified abnormal findings of blood chemistry: Secondary | ICD-10-CM

## 2022-03-23 ENCOUNTER — Other Ambulatory Visit: Payer: Managed Care, Other (non HMO)

## 2022-03-24 ENCOUNTER — Other Ambulatory Visit: Payer: Self-pay | Admitting: Family

## 2022-03-24 DIAGNOSIS — R7989 Other specified abnormal findings of blood chemistry: Secondary | ICD-10-CM

## 2022-03-24 LAB — SPECIMEN STATUS REPORT

## 2022-03-24 LAB — TESTOSTERONE: Testosterone: 251 ng/dL — ABNORMAL LOW (ref 264–916)

## 2022-03-30 LAB — COLOGUARD: COLOGUARD: NEGATIVE

## 2022-07-07 ENCOUNTER — Encounter: Payer: Self-pay | Admitting: Physician Assistant

## 2022-07-07 ENCOUNTER — Ambulatory Visit (INDEPENDENT_AMBULATORY_CARE_PROVIDER_SITE_OTHER): Payer: Managed Care, Other (non HMO)

## 2022-07-07 ENCOUNTER — Ambulatory Visit: Payer: Managed Care, Other (non HMO) | Admitting: Physician Assistant

## 2022-07-07 VITALS — BP 121/81 | HR 78 | Temp 98.7°F | Resp 16 | Wt 197.0 lb

## 2022-07-07 DIAGNOSIS — M79601 Pain in right arm: Secondary | ICD-10-CM

## 2022-07-07 DIAGNOSIS — M7989 Other specified soft tissue disorders: Secondary | ICD-10-CM | POA: Diagnosis not present

## 2022-07-07 MED ORDER — CYCLOBENZAPRINE HCL 5 MG PO TABS: 5.0000 mg | ORAL_TABLET | Freq: Three times a day (TID) | ORAL | 1 refills | Status: DC | PRN

## 2022-07-07 MED ORDER — KETOROLAC TROMETHAMINE 60 MG/2ML IM SOLN
60.0000 mg | Freq: Once | INTRAMUSCULAR | Status: AC
  Administered 2022-07-07: 60 mg via INTRAMUSCULAR

## 2022-07-07 MED ORDER — PREDNISONE 20 MG PO TABS: ORAL_TABLET | ORAL | 0 refills | Status: DC

## 2022-07-07 NOTE — Progress Notes (Signed)
Patient ID: Isaac Gutierrez., male   DOB: [birth year], 46 y.o.   MRN: [id number]   Isaac Gutierrez, is a 46 y.o. male  WUJ:[id number]  [id number]  DOB - Jul 29, 1976  No chief complaint on file.      Subjective:   Isaac Gutierrez is a 46 y.o. male here today for 2-3 week h/o R forearm pain esp with pronation and supination.  +swelling.  No point tenderness.  Various movements at the forearm increase the pain.  Ibuprofen has not helped.  NKI.  He does a lot of lifting and moving stuff at work, but does not remember any definite injury.  Pain is burning and aching with occasional shotting pains.  No CP/SOB.  No FH or personal h/o blood clot.  No fever.  No tick bite.  No scratches.     No problems updated.  ALLERGIES: No Known Allergies  PAST MEDICAL HISTORY: No past medical history on file.  MEDICATIONS AT HOME: Prior to Admission medications   Medication Sig Start Date End Date Taking? Authorizing Provider  cyclobenzaprine (FLEXERIL) 5 MG tablet Take 1 tablet (5 mg total) by mouth 3 (three) times daily as needed for muscle spasms. 07/07/22  Yes Tedd Cottrill M, PA-C  predniSONE (DELTASONE) 20 MG tablet 3,3,3,2,2,2,1,1,1 take each days dose at once in the morning with food 07/07/22  Yes Rosali Augello M, PA-C    ROS: Neg HEENT Neg resp Neg cardiac Neg GI Neg GU Neg psych Neg neuro  Objective:   Vitals:   07/07/22 1052  BP: 121/81  Pulse: 78  Resp: 16  Temp: 98.7 F (37.1 C)  TempSrc: Oral  SpO2: 97%  Weight: 197 lb (89.4 kg)   Exam General appearance : Awake, alert, not in any distress. Speech Clear. Not toxic looking HEENT: Atraumatic and Normocephalic Neck: Supple, no JVD. No cervical lymphadenopathy.  Chest: Good air entry bilaterally, CTAB.  No rales/rhonchi/wheezing CVS: S1 S2 regular, no murmurs.  Very hesitant with forearm movements.  R compared to L-there is mild swelling from about mid forearm and up to [facility] fossa.  There is no point  tenderness of effusion of the elbow.  I am unable to identify point tenderness.  ROM reduced to about 80% with pronation, supination and flexion. Grip strength normal B Extremities: B/L Lower Ext shows no edema, both legs are warm to touch Neurology: Awake alert, and oriented X 3, CN II-XII intact, Non focal Skin: No Rash  Data Review Lab Results  Component Value Date   HGBA1C 5.1 03/10/2022    Assessment & Plan   1. Right arm pain Uncertain etiology.  81mg  aspirin given po in office and advised to take one tomorrow morning as well - ketorolac (TORADOL) injection 60 mg - Uric Acid - CBC with Differential/Platelet - Comprehensive metabolic panel - DG Elbow Complete Right; Future - DG Forearm Right; Future - predniSONE (DELTASONE) 20 MG tablet; 3,3,3,2,2,2,1,1,1 take each days dose at once in the morning with food  Dispense: 18 tablet; Refill: 0 - cyclobenzaprine (FLEXERIL) 5 MG tablet; Take 1 tablet (5 mg total) by mouth 3 (three) times daily as needed for muscle spasms.  Dispense: 30 tablet; Refill: 1 - Ambulatory referral to Orthopedic Surgery - US Venous Img Lower Unilateral Right (DVT); Future  2. Arm swelling See #1 - DG Elbow Complete Right; Future - DG Forearm Right; Future - predniSONE (DELTASONE) 20 MG tablet; 3,3,3,2,2,2,1,1,1 take each days dose at once in the morning with food  Dispense: 18 tablet; Refill:  0 - cyclobenzaprine (FLEXERIL) 5 MG tablet; Take 1 tablet (5 mg total) by mouth 3 (three) times daily as needed for muscle spasms.  Dispense: 30 tablet; Refill: 1 - Ambulatory referral to Orthopedic Surgery - US Venous Img Lower Unilateral Right (DVT); Future    F/up after studies  The patient was given clear instructions to go to ER or return to medical center if symptoms don't improve, worsen or new problems develop. The patient verbalized understanding. The patient was told to call to get lab results if they haven't heard anything in the next week.       Georgian Co, PA-C [facility] [place], [place] [phone]   07/07/2022, 12:00 PM

## 2022-07-07 NOTE — Progress Notes (Signed)
Patient

## 2022-07-08 ENCOUNTER — Ambulatory Visit
Admission: RE | Admit: 2022-07-08 | Discharge: 2022-07-08 | Disposition: A | Discharge status: Home / Self Care | Payer: Managed Care, Other (non HMO) | Source: Ambulatory Visit | Attending: Physician Assistant | Admitting: Physician Assistant

## 2022-07-08 ENCOUNTER — Ambulatory Visit (INDEPENDENT_AMBULATORY_CARE_PROVIDER_SITE_OTHER): Payer: Managed Care, Other (non HMO) | Admitting: Orthopaedic Surgery

## 2022-07-08 ENCOUNTER — Ambulatory Visit: Payer: Managed Care, Other (non HMO) | Admitting: Family Medicine

## 2022-07-08 DIAGNOSIS — M79601 Pain in right arm: Secondary | ICD-10-CM | POA: Diagnosis not present

## 2022-07-08 DIAGNOSIS — M7989 Other specified soft tissue disorders: Secondary | ICD-10-CM

## 2022-07-08 LAB — CBC WITH DIFFERENTIAL/PLATELET
Basophils Absolute: 0.1 10*3/uL (ref 0.0–0.2)
Basos: 1 %
EOS (ABSOLUTE): 0.1 10*3/uL (ref 0.0–0.4)
Eos: 3 %
Hematocrit: 43.6 % (ref 37.5–51.0)
Hemoglobin: 14.3 g/dL (ref 13.0–17.7)
Immature Grans (Abs): 0 10*3/uL (ref 0.0–0.1)
Immature Granulocytes: 0 %
Lymphocytes Absolute: 2.3 10*3/uL (ref 0.7–3.1)
Lymphs: 48 %
MCH: 31.2 pg (ref 26.6–33.0)
MCHC: 32.8 g/dL (ref 31.5–35.7)
MCV: 95 fL (ref 79–97)
Monocytes Absolute: 0.4 10*3/uL (ref 0.1–0.9)
Monocytes: 8 %
Neutrophils Absolute: 1.9 10*3/uL (ref 1.4–7.0)
Neutrophils: 40 %
Platelets: 281 10*3/uL (ref 150–450)
RBC: 4.59 x10E6/uL (ref 4.14–5.80)
RDW: 11.6 % (ref 11.6–15.4)
WBC: 4.7 10*3/uL (ref 3.4–10.8)

## 2022-07-08 LAB — COMPREHENSIVE METABOLIC PANEL
ALT: 28 IU/L (ref 0–44)
AST: 30 IU/L (ref 0–40)
Albumin/Globulin Ratio: 1.8 (ref 1.2–2.2)
Albumin: 4.7 g/dL (ref 4.1–5.1)
Alkaline Phosphatase: 55 IU/L (ref 44–121)
BUN/Creatinine Ratio: 9 (ref 9–20)
BUN: 9 mg/dL (ref 6–24)
Bilirubin Total: 0.6 mg/dL (ref 0.0–1.2)
CO2: 23 mmol/L (ref 20–29)
Calcium: 9.6 mg/dL (ref 8.7–10.2)
Chloride: 100 mmol/L (ref 96–106)
Creatinine, Ser: 0.96 mg/dL (ref 0.76–1.27)
Globulin, Total: 2.6 g/dL (ref 1.5–4.5)
Glucose: 80 mg/dL (ref 70–99)
Potassium: 4.3 mmol/L (ref 3.5–5.2)
Sodium: 140 mmol/L (ref 134–144)
Total Protein: 7.3 g/dL (ref 6.0–8.5)
eGFR: 98 mL/min/{1.73_m2} (ref >59)

## 2022-07-08 LAB — URIC ACID: Uric Acid: 5.7 mg/dL (ref 3.8–8.4)

## 2022-07-08 NOTE — Progress Notes (Signed)
Office Visit Note   Patient: Isaac Gutierrez.           Date of Birth: 1976/01/28           MRN: [id number] Visit Date: 07/08/2022              Requested by: Argentina Donovan, PA-C [address] [place],  [place] [address] PCP: Dorna Mai, MD   Assessment & Plan: Visit Diagnoses:  1. Right arm pain     Plan: Impression is resolving right forearm rhabdomyolysis from overuse of the right upper extremity.  At this point, we are not concerned for compartment syndrome.  Recent CMP obtained from his primary care provider did not show any renal impairment.  We would like to order a creatinine kinase today.  We will have him treat this symptomatically with rest, ice, NSAIDs.  He understands that he needs to refrain from any exercising of his arm or lifting for work until he feels 100%.  He will follow-up with [place] as needed.  He will call with any concerns or questions.  Follow-Up Instructions: Return if symptoms worsen or fail to improve.   Orders:  Orders Placed This Encounter  Procedures   CK (Creatine Kinase)   No orders of the defined types were placed in this encounter.     Procedures: No procedures performed   Clinical Data: No additional findings.   Subjective: Chief Complaint  Patient presents with   Right Arm - Pain    HPI patient is a very pleasant 46 year old who comes in today with right forearm pain for the past 3 to 4 weeks.  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  He denies any specific injury.  His symptoms have been worsening over the past week.  The pain is primarily to the mid to proximal volar aspect of the forearm.  He really has minimal rest pain but does have increased pain with pronation of the forearm as well as slight pain with wrist extension.  He was recently seen by his primary care provider where he was started on a steroid.  This is  helping with his symptoms.  Also of note, CMP was recently drawn which did not show any renal failure.  Review of Systems as detailed in HPI.  All others reviewed and are negative.   Objective: Vital Signs: There were no vitals taken for this visit.  Physical Exam well-developed well-nourished gentleman in no acute distress.  Alert and oriented x3.  Ortho Exam right upper extremity exam reveals minimal and diffuse tenderness throughout the volar and dorsal proximal forearm.  He does have mildly increased pain with pronation and wrist extension.  Minimal tenderness to the lateral epicondyle and radial tunnel.  No tenderness to the medial epicondyle.  No specific tenderness to the extensor or flexor tendons.  Mild swelling.  No tenderness to the first dorsal compartment.  He is neurovascular intact distally.  Specialty Comments:  No specialty comments available.  Imaging: No new imaging   PMFS History: Patient Active Problem List   Diagnosis Date Noted   Pain in finger 12/16/2021   Pain of left shoulder joint on movement 08/17/2021   No past medical history on file.  Family History  Problem Relation Age of Onset   Alcohol abuse Father    Diabetes Sister     No past surgical history on  file. Social History   Occupational History   Not on file  Tobacco Use   Smoking status: Never   Smokeless tobacco: Not on file  Vaping Use   Vaping Use: Never used  Substance and Sexual Activity   Alcohol use: No   Drug use: No   Sexual activity: Yes

## 2022-07-09 LAB — CK: Total CK: 332 U/L — ABNORMAL HIGH (ref 44–196)

## 2022-08-01 ENCOUNTER — Other Ambulatory Visit (HOSPITAL_COMMUNITY): Payer: Self-pay

## 2022-08-01 MED ORDER — CLOMIPHENE CITRATE 50 MG PO TABS
25.0000 mg | ORAL_TABLET | Freq: Every day | ORAL | 3 refills | Status: AC
  Filled 2022-08-01: qty 15, 30d supply, fill #0
  Filled 2022-08-28: qty 15, 30d supply, fill #1
  Filled 2022-11-08 (×2): qty 15, 30d supply, fill #2

## 2022-08-29 ENCOUNTER — Other Ambulatory Visit (HOSPITAL_COMMUNITY): Payer: Self-pay

## 2022-11-08 ENCOUNTER — Encounter: Payer: Self-pay | Admitting: Orthopaedic Surgery

## 2022-11-08 ENCOUNTER — Ambulatory Visit: Payer: Managed Care, Other (non HMO) | Admitting: Orthopaedic Surgery

## 2022-11-08 ENCOUNTER — Other Ambulatory Visit (HOSPITAL_COMMUNITY): Payer: Self-pay

## 2022-11-08 DIAGNOSIS — G5603 Carpal tunnel syndrome, bilateral upper limbs: Secondary | ICD-10-CM

## 2022-11-08 NOTE — Progress Notes (Signed)
   Office Visit Note   Patient: Isaac Gutierrez.           Date of Birth: [birth year]           MRN: [id number] Visit Date: 11/08/2022              Requested by: Dorna Mai, [address] [address],  [place] [address] PCP: Dorna Mai, MD   Assessment & Plan: Visit Diagnoses:  1. Bilateral carpal tunnel syndrome     Plan: Impression is bilateral hand carpal tunnel syndrome left greater than right.  At this point, we have discussed various treatment options to include night splinting as well as obtaining a nerve conduction study/EMG followed by possible cortisone injection versus surgical intervention.  He is interested in trying the nighttime braces and getting a nerve conduction study but is really not interested in any other treatment options at this point.  He will follow-up with [place] once the nerve conduction study has been completed.  Follow-Up Instructions: Return for f/u after ncs.   Orders:  No orders of the defined types were placed in this encounter.  No orders of the defined types were placed in this encounter.     Procedures: No procedures performed   Clinical Data: No additional findings.   Subjective: Chief Complaint  Patient presents with   Right Hand - Numbness   Left Hand - Numbness    HPI patient is a pleasant 47 year old right-hand-dominant gentleman who comes in today with bilateral hand paresthesias left greater than right.  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  He complains of numbness and tingling to all 10 fingers which frequently occurs at night as well as sometimes throughout the day.  He does note improvement in symptoms when he is shaking his hands.  He has not tried nighttime bracing.  No carpal tunnel injections.  Review of Systems as detailed in HPI.  All others reviewed and are negative.   Objective: Vital Signs: There were no vitals taken for this visit.  Physical Exam well-developed  well-nourished gentleman in no acute distress.  Alert and oriented x 3.  Ortho Exam bilateral hand exam shows no thenar atrophy.  Negative Phalen and negative Tinel.  He is neurovascularly intact distally.  Specialty Comments:  No specialty comments available.  Imaging: No new imaging   PMFS History: Patient Active Problem List   Diagnosis Date Noted   Pain in finger 12/16/2021   Pain of left shoulder joint on movement 08/17/2021   History reviewed. No pertinent past medical history.  Family History  Problem Relation Age of Onset   Alcohol abuse Father    Diabetes Sister     History reviewed. No pertinent surgical history. Social History   Occupational History   Not on file  Tobacco Use   Smoking status: Never   Smokeless tobacco: Not on file  Vaping Use   Vaping Use: Never used  Substance and Sexual Activity   Alcohol use: No   Drug use: No   Sexual activity: Yes

## 2022-11-18 ENCOUNTER — Ambulatory Visit (INDEPENDENT_AMBULATORY_CARE_PROVIDER_SITE_OTHER): Payer: Managed Care, Other (non HMO) | Admitting: Physical Medicine and Rehabilitation

## 2022-11-18 DIAGNOSIS — R202 Paresthesia of skin: Secondary | ICD-10-CM

## 2022-11-18 DIAGNOSIS — G8929 Other chronic pain: Secondary | ICD-10-CM

## 2022-11-18 DIAGNOSIS — M79641 Pain in right hand: Secondary | ICD-10-CM | POA: Diagnosis not present

## 2022-11-18 DIAGNOSIS — M25512 Pain in left shoulder: Secondary | ICD-10-CM

## 2022-11-18 DIAGNOSIS — M79642 Pain in left hand: Secondary | ICD-10-CM

## 2022-11-18 DIAGNOSIS — M79601 Pain in right arm: Secondary | ICD-10-CM | POA: Diagnosis not present

## 2022-11-18 DIAGNOSIS — M25511 Pain in right shoulder: Secondary | ICD-10-CM

## 2022-11-18 NOTE — Progress Notes (Signed)
Functional Pain Scale - descriptive words and definitions  No Pain (0)   No Pain/Loss of function  Average Pain 0  Right handed. Numbness and tingling in hands

## 2022-11-23 NOTE — Procedures (Signed)
EMG & NCV Findings: Evaluation of the left median motor and the right median motor nerves showed prolonged distal onset latency (L6.1, R6.1 ms) and decreased conduction velocity (Elbow-Wrist, L46, R49 m/s).  The left median (across palm) sensory and the right median (across palm) sensory nerves showed prolonged distal peak latency (Wrist, L6.7, R6.7 ms), reduced amplitude (L5.3, R4.3 V), and prolonged distal peak latency (Palm, L6.5, R5.9 ms).  All remaining nerves (as indicated in the following tables) were within normal limits.  All left vs. right side differences were within normal limits.    All examined muscles (as indicated in the following table) showed no evidence of electrical instability.    Impression: The above electrodiagnostic study is ABNORMAL and reveals evidence of a moderate to severe bilateral median nerve entrapment at the wrist (carpal tunnel syndrome) affecting sensory and motor components.   There is no significant electrodiagnostic evidence of any other focal nerve entrapment, brachial plexopathy or cervical radiculopathy.   Recommendations: 1.  Follow-up with referring physician. 2.  Continue current management of symptoms. 3.  Suggest surgical evaluation.  ___________________________ Laurence Spates FAAPMR Board Certified, American Board of Physical Medicine and Rehabilitation    Nerve Conduction Studies Anti Sensory Summary Table   Stim Site NR Peak (ms) Norm Peak (ms) P-T Amp (V) Norm P-T Amp Site1 Site2 Delta-P (ms) Dist (cm) Vel (m/s) Norm Vel (m/s)  Left Median Acr Palm Anti Sensory (2nd Digit)  30.3C  Wrist    *6.7 <3.6 *5.3 >10 Wrist Palm 0.2 0.0    Palm    *6.5 <2.0 5.8         Right Median Acr Palm Anti Sensory (2nd Digit)  30.7C  Wrist    *6.7 <3.6 *4.3 >10 Wrist Palm 0.8 0.0    Palm    *5.9 <2.0 2.1         Right Radial Anti Sensory (Base 1st Digit)  30.5C  Wrist    2.1 <3.1 30.8  Wrist Base 1st Digit 2.1 0.0    Right Ulnar Anti Sensory (5th  Digit)  30.8C  Wrist    3.6 <3.7 22.8 >15.0 Wrist 5th Digit 3.6 14.0 39 >38   Motor Summary Table   Stim Site NR Onset (ms) Norm Onset (ms) O-P Amp (mV) Norm O-P Amp Site1 Site2 Delta-0 (ms) Dist (cm) Vel (m/s) Norm Vel (m/s)  Left Median Motor (Abd Poll Brev)  30.1C  Wrist    *6.1 <4.2 5.3 >5 Elbow Wrist 5.0 23.0 *46 >50  Elbow    11.1  5.6         Right Median Motor (Abd Poll Brev)  30.7C  Wrist    *6.1 <4.2 8.8 >5 Elbow Wrist 4.8 23.5 *49 >50  Elbow    10.9  8.3         Right Ulnar Motor (Abd Dig Min)  29.7C  Wrist    3.2 <4.2 7.1 >3 B Elbow Wrist 3.8 22.0 58 >53  B Elbow    7.0  8.2  A Elbow B Elbow 1.0 10.0 100 >53  A Elbow    8.0  7.9          EMG   Side Muscle Nerve Root Ins Act Fibs Psw Amp Dur Poly Recrt Int Fraser Din Comment  Right Abd Poll Brev Median C8-T1 Nml Nml Nml Nml Nml 0 Nml Nml   Right 1stDorInt Ulnar C8-T1 Nml Nml Nml Nml Nml 0 Nml Nml   Right PronatorTeres Median C6-7 Nml Nml Nml Nml  Nml 0 Nml Nml   Right Biceps Musculocut C5-6 Nml Nml Nml Nml Nml 0 Nml Nml   Right Deltoid Axillary C5-6 Nml Nml Nml Nml Nml 0 Nml Nml     Nerve Conduction Studies Anti Sensory Left/Right Comparison   Stim Site L Lat (ms) R Lat (ms) L-R Lat (ms) L Amp (V) R Amp (V) L-R Amp (%) Site1 Site2 L Vel (m/s) R Vel (m/s) L-R Vel (m/s)  Median Acr Palm Anti Sensory (2nd Digit)  30.3C  Wrist *6.7 *6.7 0.0 *5.3 *4.3 18.9 Wrist Palm     Palm *6.5 *5.9 0.6 5.8 2.1 63.8       Radial Anti Sensory (Base 1st Digit)  30.5C  Wrist  2.1   30.8  Wrist Base 1st Digit     Ulnar Anti Sensory (5th Digit)  30.8C  Wrist  3.6   22.8  Wrist 5th Digit  39    Motor Left/Right Comparison   Stim Site L Lat (ms) R Lat (ms) L-R Lat (ms) L Amp (mV) R Amp (mV) L-R Amp (%) Site1 Site2 L Vel (m/s) R Vel (m/s) L-R Vel (m/s)  Median Motor (Abd Poll Brev)  30.1C  Wrist *6.1 *6.1 0.0 5.3 8.8 39.8 Elbow Wrist *46 *49 3  Elbow 11.1 10.9 0.2 5.6 8.3 32.5       Ulnar Motor (Abd Dig Min)  29.7C  Wrist  3.2   7.1   B Elbow Wrist  58   B Elbow  7.0   8.2  A Elbow B Elbow  100   A Elbow  8.0   7.9           Waveforms:

## 2022-11-23 NOTE — Progress Notes (Unsigned)
   Office Visit Note   Patient: Isaac Gutierrez.           Date of Birth: [birth year]           MRN: [id number] Visit Date: 11/24/2022              Requested by: Dorna Mai, [address] [place],  [place] [address] PCP: Dorna Mai, MD   Assessment & Plan: Visit Diagnoses:  1. Right carpal tunnel syndrome   2. Left carpal tunnel syndrome     Plan: Impression is bilateral moderate to severe carpal tunnel syndrome found on EMGs.  Disease process and treatment options were explained.  Given his circumstances we decided the best thing is to do bilateral carpal tunnel releases.  He has a wife that can help him during the immediate postoperative period.  Details of surgery including risk benefits prognosis reviewed.  Isaac Gutierrez will call the patient to confirm surgery date.  Follow-Up Instructions: No follow-ups on file.   Orders:  No orders of the defined types were placed in this encounter.  No orders of the defined types were placed in this encounter.     Procedures: No procedures performed   Clinical Data: No additional findings.   Subjective: No chief complaint on file.   HPI Patient is a pleasant 47 year old gentleman who returns today for review of nerve conduction studies Review of Systems  Constitutional: Negative.   HENT: Negative.    Eyes: Negative.   Respiratory: Negative.    Cardiovascular: Negative.   Gastrointestinal: Negative.   Endocrine: Negative.   Genitourinary: Negative.   Skin: Negative.   Allergic/Immunologic: Negative.   Neurological: Negative.   Hematological: Negative.   Psychiatric/Behavioral: Negative.    All other systems reviewed and are negative.    Objective: Vital Signs: There were no vitals taken for this visit.  Physical Exam Vitals and nursing note reviewed.  Constitutional:      Appearance: He is well-developed.  Pulmonary:     Effort: Pulmonary effort is normal.  Abdominal:     Palpations:  Abdomen is soft.  Skin:    General: Skin is warm.  Neurological:     Mental Status: He is alert and oriented to person, place, and time.  Psychiatric:        Behavior: Behavior normal.        Thought Content: Thought content normal.        Judgment: Judgment normal.     Ortho Exam Examination of bilateral hands is unchanged. Specialty Comments:  No specialty comments available.  Imaging: No results found.   PMFS History: Patient Active Problem List   Diagnosis Date Noted   Pain in finger 12/16/2021   Pain of left shoulder joint on movement 08/17/2021   No past medical history on file.  Family History  Problem Relation Age of Onset   Alcohol abuse Father    Diabetes Sister     No past surgical history on file. Social History   Occupational History   Not on file  Tobacco Use   Smoking status: Never   Smokeless tobacco: Not on file  Vaping Use   Vaping Use: Never used  Substance and Sexual Activity   Alcohol use: No   Drug use: No   Sexual activity: Yes

## 2022-11-23 NOTE — Progress Notes (Signed)
Isaac Gutierrez. - 47 y.o. male MRN [id number]  Date of birth: 08/25/1976  Office Visit Note: Visit Date: 11/18/2022 PCP: Dorna Mai, MD Referred by: Leandrew Koyanagi, MD  Subjective: Chief Complaint  Patient presents with   Right Hand - Numbness   Left Hand - Numbness   HPI: Isaac Tapp. is a 47 y.o. male who comes in today at the request of Dr. Eduard Roux for evaluation and management of chronic, worsening and severe pain, numbness and tingling in the Bilateral upper extremities.  Patient is Right hand dominant.  He reports significant left more than right hand pain globally with numbness globally in all the digits.  He has a history of neck and shoulder pain with some pain going down the arm but not really frank radicular symptoms in a dermatomal fashion.  He has not had prior electrodiagnostic studies.  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  He does get nocturnal complaints as well.  No history of diabetes.  Symptoms have been ongoing for several months but worsening.  He has had muscle relaxer and anti-inflammatory without much relief.     Review of Systems  Musculoskeletal:  Positive for joint pain and neck pain.  Neurological:  Positive for tingling.  All other systems reviewed and are negative.  Otherwise per HPI.  Assessment & Plan: Visit Diagnoses:    ICD-10-CM   1. Paresthesia of skin  R20.2 NCV with EMG (electromyography)    2. Right arm pain  M79.601     3. Chronic pain of both shoulders  M25.511    G89.29    M25.512     4. Bilateral hand pain  M79.641    M79.642        Plan: Impression: The above electrodiagnostic study is ABNORMAL and reveals evidence of a moderate to severe bilateral median nerve entrapment at the wrist (carpal tunnel syndrome) affecting sensory and motor components.   There is no significant electrodiagnostic evidence of any other focal nerve entrapment, brachial plexopathy or cervical radiculopathy.    Recommendations: 1.  Follow-up with referring physician. 2.  Continue current management of symptoms. 3.  Suggest surgical evaluation.  Meds & Orders: No orders of the defined types were placed in this encounter.   Orders Placed This Encounter  Procedures   NCV with EMG (electromyography)    Follow-up: Return for  Eduard Roux, MD.   Procedures: No procedures performed  EMG & NCV Findings: Evaluation of the left median motor and the right median motor nerves showed prolonged distal onset latency (L6.1, R6.1 ms) and decreased conduction velocity (Elbow-Wrist, L46, R49 m/s).  The left median (across palm) sensory and the right median (across palm) sensory nerves showed prolonged distal peak latency (Wrist, L6.7, R6.7 ms), reduced amplitude (L5.3, R4.3 V), and prolonged distal peak latency (Palm, L6.5, R5.9 ms).  All remaining nerves (as indicated in the following tables) were within normal limits.  All left vs. right side differences were within normal limits.    All examined muscles (as indicated in the following table) showed no evidence of electrical instability.    Impression: The above electrodiagnostic study is ABNORMAL and reveals evidence of a moderate to severe bilateral median nerve entrapment at the wrist (carpal tunnel syndrome) affecting sensory and motor components.   There is no significant electrodiagnostic evidence of any other focal nerve entrapment, brachial plexopathy or cervical radiculopathy.   Recommendations: 1.  Follow-up with referring physician. 2.  Continue current management of symptoms. 3.  Suggest surgical evaluation.  ___________________________ Laurence Spates FAAPMR Board Certified, American Board of Physical Medicine and Rehabilitation    Nerve Conduction Studies Anti Sensory Summary Table   Stim Site NR Peak (ms) Norm Peak (ms) P-T Amp (V) Norm P-T Amp Site1 Site2 Delta-P (ms) Dist (cm) Vel (m/s) Norm Vel (m/s)  Left Median Acr Palm Anti  Sensory (2nd Digit)  30.3C  Wrist    *6.7 <3.6 *5.3 >10 Wrist Palm 0.2 0.0    Palm    *6.5 <2.0 5.8         Right Median Acr Palm Anti Sensory (2nd Digit)  30.7C  Wrist    *6.7 <3.6 *4.3 >10 Wrist Palm 0.8 0.0    Palm    *5.9 <2.0 2.1         Right Radial Anti Sensory (Base 1st Digit)  30.5C  Wrist    2.1 <3.1 30.8  Wrist Base 1st Digit 2.1 0.0    Right Ulnar Anti Sensory (5th Digit)  30.8C  Wrist    3.6 <3.7 22.8 >15.0 Wrist 5th Digit 3.6 14.0 39 >38   Motor Summary Table   Stim Site NR Onset (ms) Norm Onset (ms) O-P Amp (mV) Norm O-P Amp Site1 Site2 Delta-0 (ms) Dist (cm) Vel (m/s) Norm Vel (m/s)  Left Median Motor (Abd Poll Brev)  30.1C  Wrist    *6.1 <4.2 5.3 >5 Elbow Wrist 5.0 23.0 *46 >50  Elbow    11.1  5.6         Right Median Motor (Abd Poll Brev)  30.7C  Wrist    *6.1 <4.2 8.8 >5 Elbow Wrist 4.8 23.5 *49 >50  Elbow    10.9  8.3         Right Ulnar Motor (Abd Dig Min)  29.7C  Wrist    3.2 <4.2 7.1 >3 B Elbow Wrist 3.8 22.0 58 >53  B Elbow    7.0  8.2  A Elbow B Elbow 1.0 10.0 100 >53  A Elbow    8.0  7.9          EMG   Side Muscle Nerve Root Ins Act Fibs Psw Amp Dur Poly Recrt Int Fraser Din Comment  Right Abd Poll Brev Median C8-T1 Nml Nml Nml Nml Nml 0 Nml Nml   Right 1stDorInt Ulnar C8-T1 Nml Nml Nml Nml Nml 0 Nml Nml   Right PronatorTeres Median C6-7 Nml Nml Nml Nml Nml 0 Nml Nml   Right Biceps Musculocut C5-6 Nml Nml Nml Nml Nml 0 Nml Nml   Right Deltoid Axillary C5-6 Nml Nml Nml Nml Nml 0 Nml Nml     Nerve Conduction Studies Anti Sensory Left/Right Comparison   Stim Site L Lat (ms) R Lat (ms) L-R Lat (ms) L Amp (V) R Amp (V) L-R Amp (%) Site1 Site2 L Vel (m/s) R Vel (m/s) L-R Vel (m/s)  Median Acr Palm Anti Sensory (2nd Digit)  30.3C  Wrist *6.7 *6.7 0.0 *5.3 *4.3 18.9 Wrist Palm     Palm *6.5 *5.9 0.6 5.8 2.1 63.8       Radial Anti Sensory (Base 1st Digit)  30.5C  Wrist  2.1   30.8  Wrist Base 1st Digit     Ulnar Anti Sensory (5th Digit)  30.8C  Wrist   3.6   22.8  Wrist 5th Digit  39    Motor Left/Right Comparison   Stim Site L Lat (ms) R Lat (ms) L-R Lat (  ms) L Amp (mV) R Amp (mV) L-R Amp (%) Site1 Site2 L Vel (m/s) R Vel (m/s) L-R Vel (m/s)  Median Motor (Abd Poll Brev)  30.1C  Wrist *6.1 *6.1 0.0 5.3 8.8 39.8 Elbow Wrist *46 *49 3  Elbow 11.1 10.9 0.2 5.6 8.3 32.5       Ulnar Motor (Abd Dig Min)  29.7C  Wrist  3.2   7.1  B Elbow Wrist  58   B Elbow  7.0   8.2  A Elbow B Elbow  100   A Elbow  8.0   7.9           Waveforms:                 Clinical History: No specialty comments available.   He reports that he has never smoked. He does not have any smokeless tobacco history on file.  Recent Labs    03/10/22 1510 07/07/22 1119  HGBA1C 5.1  --   LABURIC  --  5.7    Objective:  VS:  HT:    WT:   BMI:     BP:   HR: bpm  TEMP: ( )  RESP:  Physical Exam Vitals and nursing note reviewed.  Constitutional:      General: He is not in acute distress.    Appearance: He is well-developed.  HENT:     Head: Normocephalic and atraumatic.     Nose: Nose normal.     Mouth/Throat:     Mouth: Mucous membranes are moist.     Pharynx: Oropharynx is clear.  Eyes:     Conjunctiva/sclera: Conjunctivae normal.     Pupils: Pupils are equal, round, and reactive to light.  Neck:     Trachea: No tracheal deviation.  Cardiovascular:     Rate and Rhythm: Normal rate and regular rhythm.     Pulses: Normal pulses.  Pulmonary:     Effort: Pulmonary effort is normal.     Breath sounds: Normal breath sounds.  Abdominal:     General: There is no distension.     Palpations: Abdomen is soft.     Tenderness: There is no guarding or rebound.  Musculoskeletal:        General: No tenderness or deformity.     Cervical back: Normal range of motion and neck supple.     Right lower leg: No edema.     Left lower leg: No edema.     Comments: Inspection reveals no atrophy of the bilateral APB or FDI or hand intrinsics. There is no  swelling, color changes, allodynia or dystrophic changes. There is 5 out of 5 strength in the bilateral wrist extension, finger abduction and long finger flexion. There is intact sensation to light touch in all dermatomal and peripheral nerve distributions. There is a negative Froment's test bilaterally. There is a positive Tinel's test at the bilateral wrist and elbow. There is a Positive Phalen's test bilaterally. There is a negative Hoffmann's test bilaterally.  Skin:    General: Skin is warm and dry.     Findings: No erythema or rash.  Neurological:     General: No focal deficit present.     Mental Status: He is alert and oriented to person, place, and time.     Sensory: No sensory deficit.     Motor: No weakness or abnormal muscle tone.     Coordination: Coordination normal.     Gait: Gait normal.  Psychiatric:  Mood and Affect: Mood normal.        Behavior: Behavior normal.        Thought Content: Thought content normal.     Ortho Exam  Imaging: No results found.  Past Medical/Family/Surgical/Social History: Medications & Allergies reviewed per EMR, new medications updated. There are no problems to display for this patient.  History reviewed. No pertinent past medical history. Family History  Problem Relation Age of Onset   Alcohol abuse Father    Diabetes Sister    History reviewed. No pertinent surgical history. Social History   Occupational History   Not on file  Tobacco Use   Smoking status: Never   Smokeless tobacco: Not on file  Vaping Use   Vaping Use: Never used  Substance and Sexual Activity   Alcohol use: No   Drug use: No   Sexual activity: Yes

## 2022-11-24 ENCOUNTER — Ambulatory Visit (INDEPENDENT_AMBULATORY_CARE_PROVIDER_SITE_OTHER): Payer: Managed Care, Other (non HMO) | Admitting: Orthopaedic Surgery

## 2022-11-24 ENCOUNTER — Encounter: Payer: Self-pay | Admitting: Orthopaedic Surgery

## 2022-11-24 DIAGNOSIS — G5602 Carpal tunnel syndrome, left upper limb: Secondary | ICD-10-CM

## 2022-11-24 DIAGNOSIS — G5601 Carpal tunnel syndrome, right upper limb: Secondary | ICD-10-CM | POA: Diagnosis not present

## 2022-11-28 ENCOUNTER — Encounter: Payer: Self-pay | Admitting: Physical Medicine and Rehabilitation

## 2023-01-14 ENCOUNTER — Other Ambulatory Visit: Payer: Self-pay

## 2023-01-14 ENCOUNTER — Emergency Department (HOSPITAL_BASED_OUTPATIENT_CLINIC_OR_DEPARTMENT_OTHER)
Admission: EM | Admit: 2023-01-14 | Discharge: 2023-01-14 | Disposition: A | Discharge status: Home / Self Care | Payer: Managed Care, Other (non HMO) | Attending: Emergency Medicine | Admitting: Emergency Medicine

## 2023-01-14 ENCOUNTER — Encounter (HOSPITAL_BASED_OUTPATIENT_CLINIC_OR_DEPARTMENT_OTHER): Payer: Self-pay | Admitting: Emergency Medicine

## 2023-01-14 ENCOUNTER — Emergency Department (HOSPITAL_BASED_OUTPATIENT_CLINIC_OR_DEPARTMENT_OTHER): Payer: Managed Care, Other (non HMO) | Admitting: Radiology

## 2023-01-14 DIAGNOSIS — R066 Hiccough: Secondary | ICD-10-CM | POA: Diagnosis present

## 2023-01-14 LAB — CBC WITH DIFFERENTIAL/PLATELET
Abs Immature Granulocytes: 0.01 10*3/uL (ref 0.00–0.07)
Basophils Absolute: 0 10*3/uL (ref 0.0–0.1)
Basophils Relative: 1 %
Eosinophils Absolute: 0.1 10*3/uL (ref 0.0–0.5)
Eosinophils Relative: 2 %
HCT: 40.5 % (ref 39.0–52.0)
Hemoglobin: 14 g/dL (ref 13.0–17.0)
Immature Granulocytes: 0 %
Lymphocytes Relative: 44 %
Lymphs Abs: 2.6 10*3/uL (ref 0.7–4.0)
MCH: 31.6 pg (ref 26.0–34.0)
MCHC: 34.6 g/dL (ref 30.0–36.0)
MCV: 91.4 fL (ref 80.0–100.0)
Monocytes Absolute: 0.5 10*3/uL (ref 0.1–1.0)
Monocytes Relative: 8 %
Neutro Abs: 2.6 10*3/uL (ref 1.7–7.7)
Neutrophils Relative %: 45 %
Platelets: 267 10*3/uL (ref 150–400)
RBC: 4.43 MIL/uL (ref 4.22–5.81)
RDW: 11.8 % (ref 11.5–15.5)
WBC: 5.9 10*3/uL (ref 4.0–10.5)
nRBC: 0 % (ref 0.0–0.2)

## 2023-01-14 LAB — BASIC METABOLIC PANEL
Anion gap: 10 (ref 5–15)
BUN: 11 mg/dL (ref 6–20)
CO2: 25 mmol/L (ref 22–32)
Calcium: 10.3 mg/dL (ref 8.9–10.3)
Chloride: 101 mmol/L (ref 98–111)
Creatinine, Ser: 1.14 mg/dL (ref 0.61–1.24)
GFR, Estimated: >60 mL/min (ref >60)
Glucose, Bld: 95 mg/dL (ref 70–99)
Potassium: 3.7 mmol/L (ref 3.5–5.1)
Sodium: 136 mmol/L (ref 135–145)

## 2023-01-14 LAB — MAGNESIUM: Magnesium: 2 mg/dL (ref 1.7–2.4)

## 2023-01-14 MED ORDER — DIPHENHYDRAMINE HCL 50 MG/ML IJ SOLN
50.0000 mg | Freq: Once | INTRAMUSCULAR | Status: AC
  Administered 2023-01-14: 50 mg via INTRAVENOUS
  Filled 2023-01-14: qty 1

## 2023-01-14 MED ORDER — CHLORPROMAZINE HCL 25 MG PO TABS: 25.0000 mg | ORAL_TABLET | Freq: Three times a day (TID) | ORAL | 0 refills | Status: DC

## 2023-01-14 MED ORDER — PROCHLORPERAZINE EDISYLATE 10 MG/2ML IJ SOLN
10.0000 mg | Freq: Once | INTRAMUSCULAR | Status: AC
  Administered 2023-01-14: 10 mg via INTRAVENOUS
  Filled 2023-01-14: qty 2

## 2023-01-14 MED ORDER — PANTOPRAZOLE SODIUM 40 MG PO TBEC: 40.0000 mg | DELAYED_RELEASE_TABLET | Freq: Two times a day (BID) | ORAL | 0 refills | Status: DC

## 2023-01-14 MED ORDER — FAMOTIDINE 20 MG PO TABS
20.0000 mg | ORAL_TABLET | Freq: Once | ORAL | Status: DC
  Filled 2023-01-14: qty 1

## 2023-01-14 NOTE — ED Notes (Signed)
Discharge instructions provided by EDP were reinforced to pt and s/o at bedside. Pt verbalized understanding with no additional questions at this time.

## 2023-01-14 NOTE — ED Triage Notes (Signed)
Pt ambulatory in lobby, caox4, NAD c/o continuous hiccups x1 week. Pt reports he came to ED because he feels like at times his "throat closes/spasms" and it feels like he is momentarily gasping for air. No resp distress on arrival to ED. Only recent change was having 2 teeth pulled last Friday, finished antibiotic yesterday.

## 2023-01-14 NOTE — ED Provider Notes (Signed)
[facility] Provider Note   CSN: [id number] Arrival date & time: 01/14/23  1748     History Chief Complaint  Patient presents with   Hiccups    HPI Isaac Gutierrez. is a 47 y.o. male presenting for persistent hiccups over 7 days.  History of similar 2 years ago.  Recent anesthesia for dental procedure.  States they had to put a breathing tube in his mouth but not down his throat" He denies fevers chills nausea vomiting shortness of breath.  Otherwise ambulatory tolerating p.o. intake.  No known sick contacts.  Called a telehealth urgent care prescribed Reglan earlier this week..   Patient's recorded medical, surgical, social, medication list and allergies were reviewed in the Snapshot window as part of the initial history.   Review of Systems   Review of Systems  Constitutional:  Negative for chills and fever.  HENT:  Negative for ear pain and sore throat.   Eyes:  Negative for pain and visual disturbance.  Respiratory:  Negative for cough and shortness of breath.   Cardiovascular:  Negative for chest pain and palpitations.  Gastrointestinal:  Negative for abdominal pain and vomiting.  Genitourinary:  Negative for dysuria and hematuria.  Musculoskeletal:  Negative for arthralgias and back pain.  Skin:  Negative for color change and rash.  Neurological:  Negative for seizures and syncope.  All other systems reviewed and are negative.   Physical Exam Updated Vital Signs BP 131/85 (BP Location: Right Arm)   Pulse 75   Temp 98.9 F (37.2 C) (Oral)   Resp 20   Ht 5\' 7"  (1.702 m)   Wt 91.2 kg   SpO2 100%   BMI 31.48 kg/m  Physical Exam Vitals and nursing note reviewed.  Constitutional:      General: He is not in acute distress.    Appearance: He is well-developed.  HENT:     Head: Normocephalic and atraumatic.  Eyes:     Conjunctiva/sclera: Conjunctivae normal.  Cardiovascular:     Rate and Rhythm: Normal rate and  regular rhythm.     Heart sounds: No murmur heard. Pulmonary:     Effort: Pulmonary effort is normal. No respiratory distress.     Breath sounds: Normal breath sounds.  Abdominal:     Palpations: Abdomen is soft.     Tenderness: There is no abdominal tenderness.  Musculoskeletal:        General: No swelling.     Cervical back: Neck supple.  Skin:    General: Skin is warm and dry.     Capillary Refill: Capillary refill takes less than 2 seconds.  Neurological:     Mental Status: He is alert.  Psychiatric:        Mood and Affect: Mood normal.      ED Course/ Medical Decision Making/ A&P    Procedures Procedures   Medications Ordered in ED Medications  famotidine (PEPCID) tablet 20 mg (0 mg Oral Hold 01/14/23 1907)  prochlorperazine (COMPAZINE) injection 10 mg (10 mg Intravenous Given 01/14/23 1852)  diphenhydrAMINE (BENADRYL) injection 50 mg (50 mg Intravenous Given 01/14/23 1850)    Medical Decision Making:    Kenet Viscardi. is a 47 y.o. male who presented to the ED today with persistent hiccups detailed above.    Patient has no other neurologic symptoms for central nervous system etiology.  Likely from his recent procedure intraoral and esophageal irritation from his recent gastroesophageal reflux. Will treat patient in the  emergency room with Compazine off-label and Benadryl.  Patient consented this therapy.  Will also start him on PPI outpatient. Screening labs and x-ray to evaluate for deep space neck infection or other abnormality causing his symptoms performed with no focal pathology identified. Reassuring lab work reassuring repeat physical exam with mild but not complete symptomatic improvement after treatment in the emergency room.  Will prescribe Thorazine for patient to use in the outpatient setting for the next 5 days as well as twice daily PPI.  Will refer to ENT for possible laryngospasm etiology and he will follow-up with PCP in outpatient  setting.  Disposition:  I have considered need for hospitalization, however, considering all of the above, I believe this patient is stable for discharge at this time.  Patient/family educated about specific return precautions for given chief complaint and symptoms.  Patient/family educated about follow-up with PCP/ENT.     Patient/family expressed understanding of return precautions and need for follow-up. Patient spoken to regarding all imaging and laboratory results and appropriate follow up for these results. All education provided in verbal form with additional information in written form. Time was allowed for answering of patient questions. Patient discharged.    Emergency Department Medication Summary:   Medications  famotidine (PEPCID) tablet 20 mg (0 mg Oral Hold 01/14/23 1907)  prochlorperazine (COMPAZINE) injection 10 mg (10 mg Intravenous Given 01/14/23 1852)  diphenhydrAMINE (BENADRYL) injection 50 mg (50 mg Intravenous Given 01/14/23 1850)        Clinical Impression:  1. Hiccups      Discharge   Final Clinical Impression(s) / ED Diagnoses Final diagnoses:  Hiccups    Rx / DC Orders ED Discharge Orders          Ordered    chlorproMAZINE (THORAZINE) 25 MG tablet  3 times daily        01/14/23 2017    pantoprazole (PROTONIX) 40 MG tablet  2 times daily        01/14/23 2017              Tretha Sciara, MD 01/14/23 2018

## 2023-01-16 NOTE — Progress Notes (Unsigned)
Patient ID: Isaac Gutierrez., male    DOB: 06/22/1976  MRN: [id number]  CC: Continuous Hiccups  Subjective: Isaac Gutierrez is a 47 y.o. male who presents for continuous hiccups.   His concerns today include:  01/14/2023 [facility] Emergency Department at [facility] per MD note:  Medical Decision Making:     Deaundra Bolinger. is a 47 y.o. male who presented to the ED today with persistent hiccups detailed above.    Patient has no other neurologic symptoms for central nervous system etiology.  Likely from his recent procedure intraoral and esophageal irritation from his recent gastroesophageal reflux. Will treat patient in the emergency room with Compazine off-label and Benadryl.  Patient consented this therapy.  Will also start him on PPI outpatient. Screening labs and x-ray to evaluate for deep space neck infection or other abnormality causing his symptoms performed with no focal pathology identified. Reassuring lab work reassuring repeat physical exam with mild but not complete symptomatic improvement after treatment in the emergency room.  Will prescribe Thorazine for patient to use in the outpatient setting for the next 5 days as well as twice daily PPI.  Will refer to ENT for possible laryngospasm etiology and he will follow-up with PCP in outpatient setting.   Disposition:  I have considered need for hospitalization, however, considering all of the above, I believe this patient is stable for discharge at this time.   Patient/family educated about specific return precautions for given chief complaint and symptoms.  Patient/family educated about follow-up with PCP/ENT.     Patient/family expressed understanding of return precautions and need for follow-up. Patient spoken to regarding all imaging and laboratory results and appropriate follow up for these results. All education provided in verbal form with additional information in written form. Time was allowed for  answering of patient questions. Patient discharged.    Today's visit 01/17/2023: Hiccups improved some since emergency department visit however not resolved. Reports prescriptions at emergency department discharge helped some. States the physician at the emergency department verbally told him and his wife Thorazine would be three times daily x 5 days. States the physician did not verbally give him the dosage. Reports when he went to pickup Thorazine prescription he noticed it was written for three times daily x 10 days. States he has concerns if the physician changed his mind regarding the amount of days he should take Thorazine or if maybe someone else was entering the orders such as a scribe. I discussed with patient in detail I do not have an explanation for how the physician wrote the prescription as there are no verbal records for me to refer to. I discussed with patient in detail he may want to try calling the emergency department to see if the physician or physician's nurse is able to get him an explanation. Patient verbalized understanding and agreement. Patient also has concerns as to why the xray was only of his neck and not his lungs and diaphragm as well. I discussed with patient in detail that at the time the physician ordered imaging this may not have been indicated as the note indicates the physician was trying to rule out a neck infection. Patient denies red flag symptoms today in office. Patient has not established with ENT as of present. Patient has no further issues/concerns for discussion today.   There are no problems to display for this patient.    Current Outpatient Medications on File Prior to Visit  Medication Sig Dispense Refill  chlorproMAZINE (THORAZINE) 25 MG tablet Take 1 tablet (25 mg total) by mouth 3 (three) times daily for 10 days. 30 tablet 0   clomiPHENE (CLOMID) 50 MG tablet Take 1/2 tablet (25 mg total) by mouth daily. 45 tablet 3   pantoprazole (PROTONIX) 40 MG  tablet Take 1 tablet (40 mg total) by mouth 2 (two) times daily for 14 days. 28 tablet 0   prochlorperazine (COMPAZINE) 5 MG tablet Take 5 mg by mouth every 6 (six) hours as needed for nausea or vomiting.     No current facility-administered medications on file prior to visit.    No Known Allergies  Social History   Socioeconomic History   Marital status: Married    Spouse name: Not on file   Number of children: Not on file   Years of education: Not on file   Highest education level: Not on file  Occupational History   Not on file  Tobacco Use   Smoking status: Never   Smokeless tobacco: Not on file  Vaping Use   Vaping Use: Never used  Substance and Sexual Activity   Alcohol use: No   Drug use: No   Sexual activity: Yes  Other Topics Concern   Not on file  Social History Narrative   Not on file   Social Determinants of Health   Financial Resource Strain: Patient Declined (01/13/2023)   Overall Financial Resource Strain (CARDIA)    Difficulty of Paying Living Expenses: Patient declined  Food Insecurity: Unknown (01/13/2023)   Hunger Vital Sign    Worried About Running Out of Food in the Last Year: Patient declined    Labette in the Last Year: Not on file  Transportation Needs: Unknown (01/13/2023)   PRAPARE - Transportation    Lack of Transportation (Medical): No    Lack of Transportation (Non-Medical): Patient declined  Physical Activity: Sufficiently Active (01/13/2023)   Exercise Vital Sign    Days of Exercise per Week: 4 days    Minutes of Exercise per Session: 120 min  Stress: No Stress Concern Present (01/13/2023)   [employer]    Feeling of Stress : Not at all  Social Connections: Moderately Isolated (01/13/2023)   Social Connection and Isolation Panel [NHANES]    Frequency of Communication with Friends and Family: More than three times a week    Frequency of Social Gatherings with Friends  and Family: Patient declined    Attends Religious Services: Never    Marine scientist or Organizations: No    Attends Music therapist: Not on file    Marital Status: Married  Human resources officer Violence: Not on file    Family History  Problem Relation Age of Onset   Alcohol abuse Father    Diabetes Sister     No past surgical history on file.  ROS: Review of Systems Negative except as stated above  PHYSICAL EXAM: BP 123/84 (BP Location: Right Arm, Patient Position: Sitting, Cuff Size: Normal)   Pulse 82   Temp 98.7 F (37.1 C)   Resp 16   Ht 5\' 7"  (1.702 m)   Wt 209 lb 9.6 oz (95.1 kg)   SpO2 97%   BMI 32.83 kg/m   Physical Exam HENT:     Head: Normocephalic and atraumatic.  Eyes:     Extraocular Movements: Extraocular movements intact.     Conjunctiva/sclera: Conjunctivae normal.     Pupils: Pupils are equal, round,  and reactive to light.  Cardiovascular:     Rate and Rhythm: Normal rate and regular rhythm.     Pulses: Normal pulses.     Heart sounds: Normal heart sounds.  Pulmonary:     Effort: Pulmonary effort is normal.     Breath sounds: Normal breath sounds.  Musculoskeletal:     Cervical back: Normal range of motion and neck supple.  Neurological:     General: No focal deficit present.     Mental Status: He is alert and oriented to person, place, and time.  Psychiatric:        Mood and Affect: Mood normal.        Behavior: Behavior normal.     ASSESSMENT AND PLAN: 1. Hiccups - Continue present management.  - Referral to ENT for further evaluation/management. During the interim follow-up with primary provider as scheduled.  - Ambulatory referral to ENT   Patient was given the opportunity to ask questions.  Patient verbalized understanding of the plan and was able to repeat key elements of the plan. Patient was given clear instructions to go to Emergency Department or return to medical center if symptoms don't improve, worsen, or  new problems develop.The patient verbalized understanding.   Orders Placed This Encounter  Procedures   Ambulatory referral to ENT     Return in about 4 weeks (around 02/14/2023) for Follow-Up or next available hiccups with Dorna Mai, MD.  Camillia Herter, NP

## 2023-01-17 ENCOUNTER — Ambulatory Visit (INDEPENDENT_AMBULATORY_CARE_PROVIDER_SITE_OTHER): Payer: Managed Care, Other (non HMO) | Admitting: Family

## 2023-01-17 ENCOUNTER — Encounter: Payer: Self-pay | Admitting: Family

## 2023-01-17 VITALS — BP 123/84 | HR 82 | Temp 98.7°F | Resp 16 | Ht 67.0 in | Wt 209.6 lb

## 2023-01-17 DIAGNOSIS — R066 Hiccough: Secondary | ICD-10-CM | POA: Diagnosis not present

## 2023-01-17 NOTE — Patient Instructions (Signed)
Hiccups  A hiccup is the result of a sudden irritation of a muscle that is used for breathing (diaphragm). The diaphragm is located under your lungs and above your stomach. When the diaphragm gets irritated, it may quickly tighten without your control (have a spasm). The spasm causes you to quickly suck in air, and that causes your vocal cords to close together quickly. These reactions cause the hiccup sound. Hiccups usually last only a short amount of time (less than 48 hours). In unusual cases, they can last for days or months and require you to see your health care provider. Common causes of hiccups include: Eating too fast or eating too much food. Drinking alcohol or bubbly (carbonated) drinks. Eating or drinking hot or spicy foods and drinks. Swallowing extra air when sucking on candy or a straw or when chewing on gum. Feeling nervous, stressed, or excited. Having certain conditions that irritate the diaphragm nerves. Having metabolic or nervous system disorders. Follow these instructions at home: To prevent hiccups or lessen discomfort from hiccups: Eat and chew your food slowly. Eat small meals, and avoid overeating. If you drink alcohol: Limit how much you have to: 0-1 drink a day for women who are not pregnant. 0-2 drinks a day for men. Know how much alcohol is in a drink. In the [place] one drink equals one 12 oz bottle of beer (355 mL), one 5 oz glass of wine (148 mL), or one 1 oz glass of hard liquor (44 mL). Limit your drinking of carbonated or fizzy drinks, such as soda. Avoid eating or drinking hot or spicy foods and drinks. General instructions Watch for any changes in your hiccups. Take over-the-counter and prescription medicines only as told by your health care provider. Contact a health care provider if: Your hiccups last for more than 48 hours. Your hiccups do not improve with treatment. You cannot sleep or eat because of your hiccups. You have unexpected weight loss  because of your hiccups. You have numbness, tingling, or weakness. Get help right away if: You have trouble breathing or swallowing. You have severe pain in your abdomen. These symptoms may represent a serious problem that is an emergency. Do not wait to see if the symptoms will go away. Get medical help right away. Call your local emergency services (911 in the [place] Do not drive yourself to the hospital. Summary A hiccup is the result of a sudden irritation of a muscle that is used for breathing (diaphragm). Hiccups can be caused by many things, including eating too fast. Call your health care provider if your hiccups last for more than 48 hours. This information is not intended to replace advice given to you by your health care provider. Make sure you discuss any questions you have with your health care provider. Document Revised: 06/05/2020 Document Reviewed: 06/05/2020 Elsevier Patient Education  2023 Elsevier Inc.  

## 2023-01-17 NOTE — Progress Notes (Signed)
Pt here for continuous hiccups  Pt complaining of hiccups on and off for X10 days Pt states this started the day after he had dental surgery   Pt was seen at the hospital for same and prescribed medication, states the medication is helping

## 2023-01-20 ENCOUNTER — Telehealth: Payer: Self-pay | Admitting: Family Medicine

## 2023-01-20 NOTE — Telephone Encounter (Signed)
Pt is calling to follow up on new referral to ENT for Hiccups Please advise CB- [phone]

## 2023-01-20 NOTE — Telephone Encounter (Signed)
Please advise about new referral

## 2023-01-27 ENCOUNTER — Encounter: Payer: Self-pay | Admitting: *Deleted

## 2023-02-16 ENCOUNTER — Ambulatory Visit: Payer: Managed Care, Other (non HMO) | Admitting: Family Medicine

## 2023-07-28 ENCOUNTER — Ambulatory Visit: Payer: No Typology Code available for payment source | Admitting: Orthopaedic Surgery

## 2023-07-28 ENCOUNTER — Encounter: Payer: Self-pay | Admitting: Orthopaedic Surgery

## 2023-07-28 DIAGNOSIS — G5601 Carpal tunnel syndrome, right upper limb: Secondary | ICD-10-CM

## 2023-07-28 DIAGNOSIS — G5603 Carpal tunnel syndrome, bilateral upper limbs: Secondary | ICD-10-CM | POA: Diagnosis not present

## 2023-07-28 DIAGNOSIS — G5602 Carpal tunnel syndrome, left upper limb: Secondary | ICD-10-CM

## 2023-07-28 MED ORDER — GABAPENTIN 100 MG PO CAPS: 100.0000 mg | ORAL_CAPSULE | Freq: Every evening | ORAL | 3 refills | Status: AC | PRN

## 2023-07-28 NOTE — Progress Notes (Signed)
   Office Visit Note   Patient: Isaac Gutierrez.           Date of Birth: 1976/03/09           MRN: [id number] Visit Date: 07/28/2023              Requested by: Georganna Skeans, MD [address] [place],  [place] [address] PCP: Georganna Skeans, MD   Assessment & Plan: Visit Diagnoses:  1. Right carpal tunnel syndrome   2. Left carpal tunnel syndrome     Plan: Zeppelin is a 47 year old gentleman with bilateral moderate to severe carpal tunnel syndrome.  His symptoms are worse on the right.  He would like to look at scheduling surgery for January for the right hand.  In the meantime I will prescribe gabapentin for him to take at nighttime to see if this will help with his symptoms.  He will call Debbie to schedule surgery.  Follow-Up Instructions: No follow-ups on file.   Orders:  No orders of the defined types were placed in this encounter.  No orders of the defined types were placed in this encounter.     Procedures: No procedures performed   Clinical Data: No additional findings.   Subjective: Chief Complaint  Patient presents with   Right Hand - Pain   Left Hand - Pain    HPI [name] returns today with his wife to discuss carpal tunnel surgery.  He has bilateral moderate to severe carpal tunnel syndrome.  His symptoms are the worst at night. Review of Systems  Constitutional: Negative.   HENT: Negative.    Eyes: Negative.   Respiratory: Negative.    Cardiovascular: Negative.   Gastrointestinal: Negative.   Endocrine: Negative.   Genitourinary: Negative.   Skin: Negative.   Allergic/Immunologic: Negative.   Neurological: Negative.   Hematological: Negative.   Psychiatric/Behavioral: Negative.    All other systems reviewed and are negative.    Objective: Vital Signs: There were no vitals taken for this visit.  Physical Exam Vitals and nursing note reviewed.  Constitutional:      Appearance: He is well-developed.  Pulmonary:     Effort:  Pulmonary effort is normal.  Abdominal:     Palpations: Abdomen is soft.  Skin:    General: Skin is warm.  Neurological:     Mental Status: He is alert and oriented to person, place, and time.  Psychiatric:        Behavior: Behavior normal.        Thought Content: Thought content normal.        Judgment: Judgment normal.     Ortho Exam Exam of bilateral hands is unchanged. Specialty Comments:  No specialty comments available.  Imaging: No results found.   PMFS History: There are no problems to display for this patient.  History reviewed. No pertinent past medical history.  Family History  Problem Relation Age of Onset   Alcohol abuse Father    Diabetes Sister     History reviewed. No pertinent surgical history. Social History   Occupational History   Not on file  Tobacco Use   Smoking status: Never   Smokeless tobacco: Not on file  Vaping Use   Vaping status: Never Used  Substance and Sexual Activity   Alcohol use: No   Drug use: No   Sexual activity: Yes

## 2023-08-29 ENCOUNTER — Ambulatory Visit: Payer: No Typology Code available for payment source | Admitting: Orthopaedic Surgery

## 2023-10-13 ENCOUNTER — Telehealth: Payer: Self-pay | Admitting: Orthopaedic Surgery

## 2023-10-13 NOTE — Telephone Encounter (Signed)
Left message on patient's voicemail requesting a return call so we can get him scheduled for right carpal tunnel release.  Left my name and direct number to call back and discuss date for surgery.

## 2024-02-24 ENCOUNTER — Ambulatory Visit: Admission: EM | Admit: 2024-02-24 | Discharge: 2024-02-24 | Disposition: A | Discharge status: Home / Self Care

## 2024-02-24 ENCOUNTER — Ambulatory Visit (INDEPENDENT_AMBULATORY_CARE_PROVIDER_SITE_OTHER)

## 2024-02-24 DIAGNOSIS — M79631 Pain in right forearm: Secondary | ICD-10-CM

## 2024-02-24 DIAGNOSIS — M25521 Pain in right elbow: Secondary | ICD-10-CM | POA: Diagnosis not present

## 2024-02-24 DIAGNOSIS — M7918 Myalgia, other site: Secondary | ICD-10-CM

## 2024-02-24 NOTE — Discharge Instructions (Addendum)
 X-ray done of the right forearm and right elbow.  Final evaluation by the radiologist is still pending but on brief evaluation there does not appear to be any bony abnormalities.  We did compare the forearm x-ray from a year ago and they appear to be consistent.  Likely this is an injury to the brachioradialis muscle in the forearm given the location of the pain.  This will need to follow-up with orthopedics especially since it has not improved in 12 days.  We will use an Ace wrap to help support the arm.  Wrap this from the mid forearm to above the elbow.  Make sure to check that the Ace wrap is snug but not too tight.  Use this when you are doing any activity.  Ice the area 2-3 times daily for 10-15 minutes to help with pain and swelling. Do not apply ice directly to the skin.  Recommend decreasing activity with the right arm until you follow-up with orthopedics.  Call orthopedics on Monday to schedule an appointment with them as soon as possible

## 2024-02-24 NOTE — ED Provider Notes (Addendum)
 [facility]    CSN: [id number] Arrival date & time: 02/24/24  2536      History   Chief Complaint Chief Complaint  Patient presents with   Arm Pain    HPI Gerred Arntzen. is a 48 y.o. male.   48 year old male who presents urgent care with complaints of right forearm pain and swelling.  He reports that he was working on a car and the hood fell and he went to catch it with his right arm.  He reports that when he caught it he felt something that felt like a tearing sensation in his arm.  Since then he has had difficulty flexing his wrist as well as rotating his arm.  He is having pain especially along the lateral proximal forearm extending past the elbow into the distal upper arm.  He is able to grip.  He does have a history of having rhabdomyolysis in the right arm secondary to excessive activity but is having no increased numbness or tingling in the distal hand.  He does have some chronic tingling from carpal tunnel but has had no changes in this.   Arm Pain Pertinent negatives include no chest pain, no abdominal pain and no shortness of breath.    History reviewed. No pertinent past medical history.  Patient Active Problem List   Diagnosis Date Noted   Pain in joint of left shoulder 08/17/2021    History reviewed. No pertinent surgical history.     Home Medications    Prior to Admission medications   Medication Sig Start Date End Date Taking? Authorizing Provider  diphenhydramine -acetaminophen (TYLENOL PM) 25-500 MG TABS tablet Take 1 tablet by mouth at bedtime as needed.   Yes [provider]  clomiPHENE  (CLOMID ) 50 MG tablet Take 1/2 tablet (25 mg total) by mouth daily. 08/01/22   Sherlyn Ditto, MD  gabapentin  (NEURONTIN ) 100 MG capsule Take 1-3 capsules (100-300 mg total) by mouth at bedtime as needed. 07/28/23   Wes Hamman, MD    Family History Family History  Problem Relation Age of Onset   Alcohol abuse Father    Diabetes  Sister     Social History Social History   Tobacco Use   Smoking status: Never   Smokeless tobacco: Never  Vaping Use   Vaping status: Never Used  Substance Use Topics   Alcohol use: No   Drug use: No     Allergies   Patient has no known allergies.   Review of Systems Review of Systems  Constitutional:  Negative for chills and fever.  HENT:  Negative for ear pain and sore throat.   Eyes:  Negative for pain and visual disturbance.  Respiratory:  Negative for cough and shortness of breath.   Cardiovascular:  Negative for chest pain and palpitations.  Gastrointestinal:  Negative for abdominal pain and vomiting.  Genitourinary:  Negative for dysuria and hematuria.  Musculoskeletal:  Negative for arthralgias and back pain.  Skin:  Negative for color change and rash.  Neurological:  Negative for seizures and syncope.  All other systems reviewed and are negative.    Physical Exam Triage Vital Signs ED Triage Vitals  Encounter Vitals Group     BP 02/24/24 0946 114/78     Systolic BP Percentile --      Diastolic BP Percentile --      Pulse Rate 02/24/24 0946 69     Resp 02/24/24 0946 18     Temp 02/24/24 0946 97.9 F (  36.6 C)     Temp Source 02/24/24 0946 Oral     SpO2 02/24/24 0946 97 %     Weight 02/24/24 0944 209 lb 10.5 oz (95.1 kg)     Height 02/24/24 0944 5\' 7"  (1.702 m)     Head Circumference --      Peak Flow --      Pain Score 02/24/24 0940 8     Pain Loc --      Pain Education --      Exclude from Growth Chart --    No data found.  Updated Vital Signs BP 114/78 (BP Location: Left Arm)   Pulse 69   Temp 97.9 F (36.6 C) (Oral)   Resp 18   Ht 5\' 7"  (1.702 m)   Wt 209 lb 10.5 oz (95.1 kg)   SpO2 97%   BMI 32.84 kg/m   Visual Acuity Right Eye Distance:   Left Eye Distance:   Bilateral Distance:    Right Eye Near:   Left Eye Near:    Bilateral Near:     Physical Exam Vitals and nursing note reviewed.  Constitutional:      General: He is  not in acute distress.    Appearance: He is well-developed.  HENT:     Head: Normocephalic and atraumatic.  Eyes:     Conjunctiva/sclera: Conjunctivae normal.  Cardiovascular:     Rate and Rhythm: Normal rate and regular rhythm.     Heart sounds: No murmur heard. Pulmonary:     Effort: Pulmonary effort is normal. No respiratory distress.     Breath sounds: Normal breath sounds.  Abdominal:     Palpations: Abdomen is soft.     Tenderness: There is no abdominal tenderness.  Musculoskeletal:        General: No swelling.     Right elbow: No deformity. Normal range of motion.     Right forearm: Swelling and tenderness present. No deformity or lacerations.     Right wrist: Normal pulse.     Cervical back: Neck supple.     Comments: Tenderness right around the brachioradialis muscle belly with some swelling in that area.  Patient is neurovascularly intact.  Skin:    General: Skin is warm and dry.     Capillary Refill: Capillary refill takes less than 2 seconds.  Neurological:     Mental Status: He is alert.  Psychiatric:        Mood and Affect: Mood normal.      UC Treatments / Results  Labs (all labs ordered are listed, but only abnormal results are displayed) Labs Reviewed - No data to display  EKG   Radiology DG Elbow Complete Right Result Date: 02/24/2024 CLINICAL DATA:  Pain after trauma EXAM: RIGHT ELBOW - COMPLETE 3 VIEW; RIGHT FOREARM - 2 VIEW COMPARISON:  None Available. FINDINGS: There is no evidence of fracture, dislocation, or joint effusion. There is no evidence of arthropathy or other focal bone abnormality. Soft tissues are unremarkable. IMPRESSION: No acute osseous abnormality. Electronically Signed   By: Adrianna Horde M.D.   On: 02/24/2024 10:36   DG Forearm Right Result Date: 02/24/2024 CLINICAL DATA:  Pain after trauma EXAM: RIGHT ELBOW - COMPLETE 3 VIEW; RIGHT FOREARM - 2 VIEW COMPARISON:  None Available. FINDINGS: There is no evidence of fracture,  dislocation, or joint effusion. There is no evidence of arthropathy or other focal bone abnormality. Soft tissues are unremarkable. IMPRESSION: No acute osseous abnormality. Electronically Signed  By: Adrianna Horde M.D.   On: 02/24/2024 10:36    Procedures Procedures (including critical care time)  Medications Ordered in UC Medications - No data to display  Initial Impression / Assessment and Plan / UC Course  I have reviewed the triage vital signs and the nursing notes.  Pertinent labs & imaging results that were available during my care of the patient were reviewed by me and considered in my medical decision making (see chart for details).     Right elbow pain - Plan: DG Elbow Complete Right, DG Elbow Complete Right  Pain of right forearm - Plan: DG Forearm Right, DG Forearm Right  Brachioradialis muscle tenderness   X-ray done of the right forearm and right elbow.  Final evaluation by the radiologist is still pending but on brief evaluation there does not appear to be any bony abnormalities.  We did compare the forearm x-ray from a year ago and they appear to be consistent.  Likely this is an injury to the brachioradialis muscle in the forearm given the location of the pain.  This will need to follow-up with orthopedics especially since it has not improved in 12 days.  We will use an Ace wrap to help support the arm.  Wrap this from the mid forearm to above the elbow.  Make sure to check that the Ace wrap is snug but not too tight.  Use this when you are doing any activity.  Ice the area 2-3 times daily for 10-15 minutes to help with pain and swelling. Do not apply ice directly to the skin.  Recommend decreasing activity with the right arm until you follow-up with orthopedics.  Call orthopedics on Monday to schedule an appointment with them as soon as possible  Final Clinical Impressions(s) / UC Diagnoses   Final diagnoses:  Right elbow pain  Pain of right forearm  Brachioradialis  muscle tenderness     Discharge Instructions      X-ray done of the right forearm and right elbow.  Final evaluation by the radiologist is still pending but on brief evaluation there does not appear to be any bony abnormalities.  We did compare the forearm x-ray from a year ago and they appear to be consistent.  Likely this is an injury to the brachioradialis muscle in the forearm given the location of the pain.  This will need to follow-up with orthopedics especially since it has not improved in 12 days.  We will use an Ace wrap to help support the arm.  Wrap this from the mid forearm to above the elbow.  Make sure to check that the Ace wrap is snug but not too tight.  Use this when you are doing any activity.  Ice the area 2-3 times daily for 10-15 minutes to help with pain and swelling. Do not apply ice directly to the skin.  Recommend decreasing activity with the right arm until you follow-up with orthopedics.  Call orthopedics on Monday to schedule an appointment with them as soon as possible   ED Prescriptions   None    PDMP not reviewed this encounter.   Kreg Pesa, PA-C 02/24/24 1034    Kreg Pesa, [place] 02/24/24 1040

## 2024-02-24 NOTE — ED Triage Notes (Signed)
"  My right arm is hurting, I tried to catch something in my right arm and immediately hurt a noise like Velcro or tearing in my right arm (forearm), now with pain". DOI: [id number]. Work related incident (self employed).

## 2024-02-24 NOTE — ED Notes (Signed)
 Ace wrap applied per direction to right arm (below deltoid to mid fore-arm) with Xtra provided (and instruction).

## 2024-02-27 ENCOUNTER — Telehealth: Payer: Self-pay

## 2024-02-27 ENCOUNTER — Ambulatory Visit: Admitting: Orthopaedic Surgery

## 2024-02-27 DIAGNOSIS — M25521 Pain in right elbow: Secondary | ICD-10-CM | POA: Diagnosis not present

## 2024-02-27 NOTE — Telephone Encounter (Signed)
 Patient needs an urgent MRI. Order has been placed.

## 2024-02-27 NOTE — Progress Notes (Signed)
 Office Visit Note   Patient: Isaac Gutierrez.           Date of Birth: 03/04/1976           MRN: [id number] Visit Date: 02/27/2024              Requested by: Abraham Abo, MD [address] [place],  [place] [address] PCP: Abraham Abo, MD   Assessment & Plan: Visit Diagnoses:  1. Pain in right elbow     Plan: History of Present Illness Isaac Gutierrez. is a 48 year old male who presents with right arm pain after trying to catch a heavy object.  He experiences pain in the right top forearm area after attempting to catch something heavy, with a sensation similar to 'Velcro tearing.' X-rays from urgent care were normal. Pain persists, especially with certain movements. There is no bruising, swelling, numbness, or tingling. He continues to work with the arm, but there is no improvement.  Physical Exam MUSCULOSKELETAL: No tenderness at the distal myotendinous junction of the right elbow. Right elbow extension and flexion are normal. Pronation, resisted pronation, and resisted supination cause pain in the right forearm.  Equivocal hook sign.  Results RADIOLOGY Elbow X-ray: Normal (02/24/2024)  Assessment and Plan Suspected biceps tendon tear Suspected biceps tendon tear in the right arm, likely due to catching a heavy object. Differential includes partial versus full thickness tear. MRI needed to confirm extent. - Order MRI of the right elbow. - Instructed that the imaging center will contact him to schedule the MRI.  Follow-Up Instructions: No follow-ups on file.     Subjective: Chief Complaint  Patient presents with   Other    Right arm pain    HPI  Review of Systems  Constitutional: Negative.   HENT: Negative.    Eyes: Negative.   Respiratory: Negative.    Cardiovascular: Negative.   Gastrointestinal: Negative.   Endocrine: Negative.   Genitourinary: Negative.   Skin: Negative.   Allergic/Immunologic: Negative.   Neurological:  Negative.   Hematological: Negative.   Psychiatric/Behavioral: Negative.    All other systems reviewed and are negative.    Objective: Vital Signs: There were no vitals taken for this visit.  Physical Exam Vitals and nursing note reviewed.  Constitutional:      Appearance: He is well-developed.  HENT:     Head: Normocephalic and atraumatic.  Eyes:     Pupils: Pupils are equal, round, and reactive to light.  Pulmonary:     Effort: Pulmonary effort is normal.  Abdominal:     Palpations: Abdomen is soft.  Musculoskeletal:        General: Normal range of motion.     Cervical back: Neck supple.  Skin:    General: Skin is warm.  Neurological:     Mental Status: He is alert and oriented to person, place, and time.  Psychiatric:        Behavior: Behavior normal.        Thought Content: Thought content normal.        Judgment: Judgment normal.     PMFS History: Patient Active Problem List   Diagnosis Date Noted   Pain in right elbow 02/27/2024   Pain in joint of left shoulder 08/17/2021   No past medical history on file.  Family History  Problem Relation Age of Onset   Alcohol abuse Father    Diabetes Sister     No past surgical history on file. Social History  Occupational History   Not on file  Tobacco Use   Smoking status: Never   Smokeless tobacco: Never  Vaping Use   Vaping status: Never Used  Substance and Sexual Activity   Alcohol use: No   Drug use: No   Sexual activity: Yes    Birth control/protection: None

## 2024-02-27 NOTE — Telephone Encounter (Signed)
 Order is STAT in [employer] work que.

## 2024-02-28 ENCOUNTER — Telehealth: Payer: Self-pay | Admitting: Orthopaedic Surgery

## 2024-02-28 NOTE — Telephone Encounter (Signed)
 Patient request to have his mri changed to the [facility] location ASAP

## 2024-02-28 NOTE — Telephone Encounter (Signed)
 FYI  Patient's insurance does not cover MRI. He told [facility] that he would call back to schedule if he could come up with the money. [facility], offers decreased rate and payment plan for people who are self pay. I called and discussed with patient. Isaac Gutierrez is sending to [facility] this morning so they can discuss options with patient to see if he may be able to get scan there.

## 2024-02-29 ENCOUNTER — Ambulatory Visit (HOSPITAL_BASED_OUTPATIENT_CLINIC_OR_DEPARTMENT_OTHER): Admission: RE | Admit: 2024-02-29 | Source: Ambulatory Visit

## 2024-02-29 ENCOUNTER — Encounter (HOSPITAL_BASED_OUTPATIENT_CLINIC_OR_DEPARTMENT_OTHER): Payer: Self-pay | Admitting: Orthopaedic Surgery

## 2024-02-29 ENCOUNTER — Other Ambulatory Visit (HOSPITAL_BASED_OUTPATIENT_CLINIC_OR_DEPARTMENT_OTHER): Payer: Self-pay | Admitting: Orthopaedic Surgery

## 2024-02-29 DIAGNOSIS — M25521 Pain in right elbow: Secondary | ICD-10-CM

## 2024-02-29 NOTE — Telephone Encounter (Signed)
 I spoke with patient's wife. She states that [facility] could not get him in until 5/29 and that they had called several places to see who could see him sooner. [facility] stated they could not give price estimate until they could get into referral and it needed to be changed to that location.  I advised patient's wife to call and give me update in regards to whether [facility] is going to be financially doable for them.  If not, we may can try [facility] and advise it is urgent to see if we are able to get earlier appt.

## 2024-03-01 ENCOUNTER — Other Ambulatory Visit (HOSPITAL_BASED_OUTPATIENT_CLINIC_OR_DEPARTMENT_OTHER)

## 2024-03-02 ENCOUNTER — Ambulatory Visit

## 2024-03-02 DIAGNOSIS — M25521 Pain in right elbow: Secondary | ICD-10-CM | POA: Diagnosis not present

## 2024-03-04 NOTE — Progress Notes (Signed)
 Office Visit Note   Patient: Isaac Gutierrez.           Date of Birth: 06-28-1976           MRN: [id number] Visit Date: 03/05/2024              Requested by: Abraham Abo, MD [address] [place],  [place] [address] PCP: Abraham Abo, MD   Assessment & Plan: Visit Diagnoses:  1. Pain in right elbow     Plan: History of Present Illness Isaac Gutierrez. is a 48 year old male who presents to discuss MRI scan.  He experiences persistent elbow pain since an injury on February 12, 2024. The pain intensifies with arm rotation, particularly when the palm is facing down, and lessens when the palm is facing up. MRI shows a high-grade partial tear of distal biceps with significant surrounding swelling. Tenderness is present upon palpation of a specific area of the elbow, affecting his ability to perform certain tasks without pain.  Examination of the right elbow shows slight improvement with passive range of motion.  Strength was not tested.  Results RADIOLOGY Elbow MRI: High-grade partial tear of distal biceps with surrounding edema  Assessment and Plan High-grade partial tendon tear with swelling High-grade partial tendon tear with significant swelling on MRI. Severe but incomplete tear with intact fibers preventing retraction. Pain with forearm pronation due to tendon stretching. Expected to heal without surgery if managed properly. Discussed risks of further tearing with heavy lifting. Full strength recovery expected in six to nine months. - Advise against lifting over two pounds for eight weeks from injury date. - Recommend follow-up in five weeks for reexamination. - Instruct to avoid activities causing pain, such as lifting a cup of coffee or brushing teeth with the affected arm.  Follow-Up Instructions: Return in about 5 weeks (around 04/09/2024).   Subjective: Chief Complaint  Patient presents with   Right Elbow - Follow-up    MRI elbow    Review of  Systems  Constitutional: Negative.   HENT: Negative.    Eyes: Negative.   Respiratory: Negative.    Cardiovascular: Negative.   Gastrointestinal: Negative.   Endocrine: Negative.   Genitourinary: Negative.   Skin: Negative.   Allergic/Immunologic: Negative.   Neurological: Negative.   Hematological: Negative.   Psychiatric/Behavioral: Negative.    All other systems reviewed and are negative.    Objective: Vital Signs: There were no vitals taken for this visit.  Physical Exam Vitals and nursing note reviewed.  Constitutional:      Appearance: He is well-developed.  Pulmonary:     Effort: Pulmonary effort is normal.  Abdominal:     Palpations: Abdomen is soft.  Skin:    General: Skin is warm.  Neurological:     Mental Status: He is alert and oriented to person, place, and time.  Psychiatric:        Behavior: Behavior normal.        Thought Content: Thought content normal.        Judgment: Judgment normal.     PMFS History: Patient Active Problem List   Diagnosis Date Noted   Pain in right elbow 02/27/2024   Pain in joint of left shoulder 08/17/2021   History reviewed. No pertinent past medical history.  Family History  Problem Relation Age of Onset   Alcohol abuse Father    Diabetes Sister     History reviewed. No pertinent surgical history. Social History   Occupational  History   Not on file  Tobacco Use   Smoking status: Never   Smokeless tobacco: Never  Vaping Use   Vaping status: Never Used  Substance and Sexual Activity   Alcohol use: No   Drug use: No   Sexual activity: Yes    Birth control/protection: None

## 2024-03-05 ENCOUNTER — Ambulatory Visit: Admitting: Orthopaedic Surgery

## 2024-03-05 ENCOUNTER — Encounter: Payer: Self-pay | Admitting: Orthopaedic Surgery

## 2024-03-05 DIAGNOSIS — M25521 Pain in right elbow: Secondary | ICD-10-CM | POA: Diagnosis not present

## 2024-04-09 ENCOUNTER — Ambulatory Visit: Admitting: Orthopaedic Surgery

## 2024-04-09 DIAGNOSIS — S46111A Strain of muscle, fascia and tendon of long head of biceps, right arm, initial encounter: Secondary | ICD-10-CM

## 2024-04-09 DIAGNOSIS — S46119A Strain of muscle, fascia and tendon of long head of biceps, unspecified arm, initial encounter: Secondary | ICD-10-CM | POA: Insufficient documentation

## 2024-04-09 NOTE — Progress Notes (Signed)
    Patient: Isaac Stander Jr.           Date of Birth: [birth year]           MRN: [id number] Visit Date: 04/09/2024 PCP: Tanda Bleacher, MD   Assessment & Plan:  Chief Complaint:  Chief Complaint  Patient presents with   Other    Follow up right UE biceps tear DOI: 02/12/24   Visit Diagnoses:  1. Traumatic partial tear of biceps tendon, initial encounter     Plan: History of Present Illness Isaac Coutts. is a 48 year old male who presents for follow-up of a partial distal biceps tendon tear.  He sustained an elbow injury on February 12, 2024, with an MRI on Feb 21, 2024, showing a partial tear of the distal biceps tendon. Eight weeks post-injury, he experiences improving function and pain in the elbow. Pain persists, especially with arm rotation, but is less severe and not significantly discomforting. He continues to work, minimizing use of the affected arm, and has not needed a work note.  Physical Exam MUSCULOSKELETAL: Elbow with full range of motion.  Results RADIOLOGY Elbow MRI: Partial tear of the distal biceps tendon (02/21/2024)  Assessment and Plan Partial tear of distal biceps tendon Partial tear healing with improved function and pain. - Refer to physical therapy for rehabilitation. - Schedule follow-up in six weeks to assess progress and function.  Follow-Up Instructions: Return in about 6 weeks (around 05/21/2024).   Orders:  Orders Placed This Encounter  Procedures   Ambulatory referral to Occupational Therapy   No orders of the defined types were placed in this encounter.   Imaging: No results found.  PMFS History: Patient Active Problem List   Diagnosis Date Noted   Traumatic partial tear of biceps tendon, initial encounter 04/09/2024   Pain in right elbow 02/27/2024   Pain in joint of left shoulder 08/17/2021   No past medical history on file.  Family History  Problem Relation Age of Onset   Alcohol abuse Father    Diabetes Sister      No past surgical history on file. Social History   Occupational History   Not on file  Tobacco Use   Smoking status: Never   Smokeless tobacco: Never  Vaping Use   Vaping status: Never Used  Substance and Sexual Activity   Alcohol use: No   Drug use: No   Sexual activity: Yes    Birth control/protection: None

## 2024-04-12 ENCOUNTER — Telehealth: Payer: Self-pay | Admitting: Orthopaedic Surgery

## 2024-04-12 NOTE — Telephone Encounter (Signed)
 Called patient to ask about right carpal tunnel release.  Patient states his current insurance coverage will not cover the surgery, however he will be changing coverage the first of July. He will call ready to proceed.  Patient has my name and direct number for scheduling.

## 2024-04-23 NOTE — Therapy (Signed)
 OUTPATIENT OCCUPATIONAL THERAPY ORTHO EVALUATION  Patient Name: Isaac Gutierrez. MRN: [id number] DOB:1976/05/24, 48 y.o., male Today's Date: 04/24/2024  PCP: Tanda FELIX MD REFERRING PROVIDER: Jerri Kay HERO, MD   END OF SESSION:  OT End of Session - 04/24/24 0808     Visit Number 1    Number of Visits 8    Date for OT Re-Evaluation 06/07/24    Authorization Type [employer]    OT Start Time 0808    OT Stop Time 0849    OT Time Calculation (min) 41 min    Activity Tolerance Patient tolerated treatment well;No increased pain;Patient limited by fatigue;Patient limited by pain    Behavior During Therapy [facility] for tasks assessed/performed          History reviewed. No pertinent past medical history. History reviewed. No pertinent surgical history. Patient Active Problem List   Diagnosis Date Noted   Traumatic partial tear of biceps tendon, initial encounter 04/09/2024   Pain in right elbow 02/27/2024   Pain in joint of left shoulder 08/17/2021    ONSET DATE: DOI 02/12/24  REFERRING DIAG: D53.880J (ICD-10-CM) - Traumatic partial tear of biceps tendon, initial encounter   THERAPY DIAG:  Localized edema  Muscle weakness (generalized)  Other lack of coordination  Pain in right elbow  Rationale for Evaluation and Treatment: Rehabilitation  SUBJECTIVE:   SUBJECTIVE STATEMENT: Approx 2 months post partial distal biceps rupture now. He states after the hood of his large truck extended quickly onto his outstretched hand and elbow, he felt a tearing pain in his right medial and volar elbow.  This turned out to be partial biceps tear that took place about 2 months ago.  Since then he has felt slightly better, has been resting, but still has lingering pains with forearm rotation, especially with palm down bicep flexion.  He is having some pain with work and other regular daily activities.    PERTINENT HISTORY: Nonsignificant pertinent to this injury  PRECAUTIONS: None  RED  FLAGS: None   WEIGHT BEARING RESTRICTIONS: No  PAIN:  Are you having pain? Yes: NPRS scale: 0/10 at rest up to 6/10 at worst in past week with lifting with palm down  Pain location: Right medial elbow area volarly Pain description: Aching and sometimes sharper Aggravating factors: Lifting and pronation Relieving factors: Rest and self massage  FALLS: Has patient fallen in last 6 months? No  PLOF: Independent  PATIENT GOALS: To improve pain and ability with right dominant arm and elbow  NEXT MD VISIT: As needed   OBJECTIVE: (All objective assessments below are from initial evaluation on: 04/24/24 unless otherwise specified.)   HAND DOMINANCE: Right   ADLs: Overall ADLs: States decreased ability to grab, hold household objects, pain and difficulty to open containers, lift with the palm turned down   FUNCTIONAL OUTCOME MEASURES: Eval: Patient Specific Functional Scale: 4.8 (driving, jar, door knob)  (Higher Score  =  Better Ability for the Selected Tasks)      UPPER EXTREMITY ROM     Shoulder to Wrist AROM Right eval Left eval  Elbow flexion 144 145  Elbow extension -5 -5  Forearm supination 88 83  Forearm pronation  77 89  Wrist flexion 80 79  Wrist extension 65 71  Wrist ulnar deviation    Wrist radial deviation    Functional dart thrower's motion (F-DTM) in ulnar flexion    F-DTM in radial extension     (Blank rows = not tested)   Hand AROM  Right eval  Full Fist Ability (or Gap to Distal Palmar Crease) Full fist  (Blank rows = not tested)   UPPER EXTREMITY MMT:     MMT Right 04/24/24  Shoulder flexion   Shoulder abduction   Shoulder adduction   Shoulder extension   Shoulder internal rotation   Shoulder external rotation   Middle trapezius   Lower trapezius   Elbow flexion 4+/5  Elbow extension 5/5  Forearm supination 4/5 tender  Forearm pronation 4+/5  Wrist flexion 4+/5  Wrist extension 4+/5  Wrist ulnar deviation   Wrist radial deviation    (Blank rows = not tested)  HAND FUNCTION: Eval: Mild  observed weakness in affected Rt hand.  Grip strength Right: 105 lbs no pain, Left: 111 lbs   COORDINATION: Eval: Observed gross motor coordination impairments with affected right arm and elbow due to inhibiting pain with forearm rotation  SENSATION: Eval:  Light touch intact today  EDEMA:   Eval:  Mildly swollen in right volar elbow  COGNITION: Eval: Overall cognitive status: WFL for evaluation today   OBSERVATIONS:   Eval: Subacute/healing right distal biceps partial tear that also seems to be causing some irritation to the medial epicondyle/pronator group where the lacertus fibrosis connects.   TODAY'S TREATMENT:  Post-evaluation treatment:   For safety/self-care OT recommends to use extreme caution and not allow the elbow to fully extend and lock out.  He should watch doing anything painful and prevent this, he should be careful with forceful forearm rotational motions.  He should withhold any weight lifting or heavy lifting for an additional 2 to 3 weeks, and OT will work on this with him in therapy when he is ready.  He was told to use moist heat and do self massage before doing a stretch program that is listed below.  These 3 stretches were provided, tailored to him, and he performs back with cues to ensure that these are nonpainful and an appropriate stretch.  Additionally, OT performs manual therapy kinesiotaping to help support the bicep and insertion, limit full elbow extension, increase proprioception to the elbow to help prevent further injury.  He does state a benefit from it.  He was told to remove this after 3 days to prevent skin irritation.   Exercises - Fridge Door Stretch  - 4 x daily - 3-5 reps - 15 sec hold - Forearm Supination Stretch  - 3-4 x daily - 3-5 reps - 15 sec hold - Wrist Prayer Stretch  - 4 x daily - 3-5 reps - 15 sec hold  PATIENT EDUCATION: Education details: See tx section above for  details  Person educated: Patient Education method: Verbal Instruction, Teach back, Handouts  Education comprehension: States and demonstrates understanding, Additional Education required    HOME EXERCISE PROGRAM: Access Code: [email or web address] URL: [email or web address] Date: 04/24/2024 Prepared by: Melvenia Ada   GOALS: Goals reviewed with patient? Yes   SHORT TERM GOALS: (STG required if POC>30 days) Target Date: 05/10/2024  Pt will demo/state understanding of initial HEP to improve pain levels and prerequisite motion. Goal status: INITIAL   LONG TERM GOALS: Target Date: 06/07/2024  Pt will improve functional ability by decreased impairment per PSFS assessment from 4.8 to 6.5 or better, for better quality of life. Goal status: INITIAL  2.  Pt will improve grip strength in right hand from 105 lbs to at least 115 lbs for functional use at home and in IADLs. Goal status: INITIAL  3.  Pt will improve A/ROM in right  wrist extension from 65 degrees to at least 72 degrees, to have functional motion for tasks like reach and grasp.  Goal status: INITIAL  4.  Pt will improve strength in right forearm supination from tender 4/5 MMT to at least 4+/5 MMT to have increased functional ability to carry out selfcare and higher-level homecare tasks with less difficulty. Goal status: INITIAL  5.  Pt will decrease pain at worst from 6/10 to 2/10 or better to have better sleep and occupational participation in daily roles. Goal status: INITIAL   ASSESSMENT:  CLINICAL IMPRESSION: Patient is a 48 y.o. male who was seen today for occupational therapy evaluation for pain in the right elbow area and medial epicondyle after partial biceps tear and having some mild medial epicondylitis.  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  The patient will benefit from outpatient occupational therapy to decrease  symptoms, improve functional upper extremity use, and increase quality of life.  PERFORMANCE DEFICITS: in functional skills including IADLs, ROM, strength, pain, fascial restrictions, muscle spasms, flexibility, Gross motor control, body mechanics, endurance, decreased knowledge of precautions, and UE functional use, cognitive skills including problem solving and safety awareness, and psychosocial skills including coping strategies, environmental adaptation, habits, and routines and behaviors.   IMPAIRMENTS: are limiting patient from ADLs, IADLs, rest and sleep, and leisure.   COMORBIDITIES: may have co-morbidities  that affects occupational performance. Patient will benefit from skilled OT to address above impairments and improve overall function.  MODIFICATION OR ASSISTANCE TO COMPLETE EVALUATION: No modification of tasks or assist necessary to complete an evaluation.  OT OCCUPATIONAL PROFILE AND HISTORY: Problem focused assessment: Including review of records relating to presenting problem.  CLINICAL DECISION MAKING: Moderate - several treatment options, min-mod task modification necessary  REHAB POTENTIAL: Excellent  EVALUATION COMPLEXITY: Low      PLAN:  OT FREQUENCY: 1-2x/week  OT DURATION: 6 weeks through 06/07/24 and up to 8 total visits as needed   PLANNED INTERVENTIONS: 97535 self care/ADL training, 02889 therapeutic exercise, 97530 therapeutic activity, 97112 neuromuscular re-education, 97140 manual therapy, 97035 ultrasound, 97032 electrical stimulation (manual), 97760 Orthotic Initial, [id number] Orthotic/Prosthetic subsequent, compression bandaging, Dry needling, energy conservation, coping strategies training, and patient/family education  RECOMMENDED OTHER SERVICES: none now    CONSULTED AND AGREED WITH PLAN OF CARE: Patient  PLAN FOR NEXT SESSION:   Review initial HEP and recommendations    Melvenia Ada, OTR/L, CHT  04/24/2024, 5:16 PM

## 2024-04-24 ENCOUNTER — Encounter: Payer: Self-pay | Admitting: Rehabilitative and Restorative Service Providers"

## 2024-04-24 ENCOUNTER — Ambulatory Visit: Admitting: Rehabilitative and Restorative Service Providers"

## 2024-04-24 DIAGNOSIS — M6281 Muscle weakness (generalized): Secondary | ICD-10-CM | POA: Diagnosis not present

## 2024-04-24 DIAGNOSIS — M25521 Pain in right elbow: Secondary | ICD-10-CM | POA: Diagnosis not present

## 2024-04-24 DIAGNOSIS — R278 Other lack of coordination: Secondary | ICD-10-CM | POA: Diagnosis not present

## 2024-04-24 DIAGNOSIS — R600 Localized edema: Secondary | ICD-10-CM

## 2024-05-02 NOTE — Therapy (Signed)
 OUTPATIENT OCCUPATIONAL THERAPYTREATMENT NOTE   Patient Name: Isaac Gutierrez. MRN: [id number] DOB:1976-08-28, 48 y.o., male Today's Date: 05/03/2024  PCP: Tanda FELIX MD REFERRING PROVIDER: Jerri Kay HERO, MD   END OF SESSION:  OT End of Session - 05/03/24 1028     Visit Number 2    Number of Visits 8    Date for OT Re-Evaluation 06/07/24    Authorization Type [employer]    OT Start Time 1027    OT Stop Time 1105    OT Time Calculation (min) 38 min    Activity Tolerance Patient tolerated treatment well;No increased pain;Patient limited by fatigue;Patient limited by pain    Behavior During Therapy [facility] for tasks assessed/performed           History reviewed. No pertinent past medical history. History reviewed. No pertinent surgical history. Patient Active Problem List   Diagnosis Date Noted   Traumatic partial tear of biceps tendon, initial encounter 04/09/2024   Pain in right elbow 02/27/2024   Pain in joint of left shoulder 08/17/2021    ONSET DATE: DOI 02/12/24  REFERRING DIAG: D53.880J (ICD-10-CM) - Traumatic partial tear of biceps tendon, initial encounter   THERAPY DIAG:  Localized edema  Muscle weakness (generalized)  Other lack of coordination  Pain in right elbow  Rationale for Evaluation and Treatment: Rehabilitation  PERTINENT HISTORY: Nonsignificant pertinent to this injury Approx 2 months post partial distal biceps rupture now. He states after the hood of his large truck extended quickly onto his outstretched hand and elbow, he felt a tearing pain in his right medial and volar elbow.  This turned out to be partial biceps tear that took place about 2 months ago.  Since then he has felt slightly better, has been resting, but still has lingering pains with forearm rotation, especially with palm down bicep flexion.  He is having some pain with work and other regular daily activities.   PRECAUTIONS: None; RED FLAGS: None   WEIGHT BEARING RESTRICTIONS:  No   SUBJECTIVE:   SUBJECTIVE STATEMENT: He states doing well with stretches and exercise program, though they appear to be getting easier.  He still taking it easy in terms of no weight lifting etc.   PAIN:  Are you having pain? Yes: NPRS scale: 0/10 at rest up to 3/10 at worst in past week with lifting with palm down  Pain location: Right medial elbow area volarly Pain description: Aching and sometimes sharper Aggravating factors: Lifting and pronation Relieving factors: Rest and self massage   PATIENT GOALS: To improve pain and ability with right dominant arm and elbow  NEXT MD VISIT: As needed   OBJECTIVE: (All objective assessments below are from initial evaluation on: 04/24/24 unless otherwise specified.)   HAND DOMINANCE: Right   ADLs: Overall ADLs: States decreased ability to grab, hold household objects, pain and difficulty to open containers, lift with the palm turned down   FUNCTIONAL OUTCOME MEASURES: Eval: Patient Specific Functional Scale: 4.8 (driving, jar, door knob)  (Higher Score  =  Better Ability for the Selected Tasks)      UPPER EXTREMITY ROM     Shoulder to Wrist AROM Right eval Left eval Rt 05/03/24  Elbow flexion 144 145   Elbow extension -5 -5   Forearm supination 88 83   Forearm pronation  77 89 80  Wrist flexion 80 79 86  Wrist extension 65 71 63  Wrist ulnar deviation     Wrist radial deviation  Functional dart thrower's motion (F-DTM) in ulnar flexion     F-DTM in radial extension      (Blank rows = not tested)   Hand AROM Right eval  Full Fist Ability (or Gap to Distal Palmar Crease) Full fist  (Blank rows = not tested)   UPPER EXTREMITY MMT:     MMT Right 04/24/24  Shoulder flexion   Shoulder abduction   Shoulder adduction   Shoulder extension   Shoulder internal rotation   Shoulder external rotation   Middle trapezius   Lower trapezius   Elbow flexion 4+/5  Elbow extension 5/5  Forearm supination 4/5 tender   Forearm pronation 4+/5  Wrist flexion 4+/5  Wrist extension 4+/5  Wrist ulnar deviation   Wrist radial deviation   (Blank rows = not tested)  HAND FUNCTION: Eval: Mild  observed weakness in affected Rt hand.  Grip strength Right: 105 lbs no pain, Left: 111 lbs   COORDINATION: Eval: Observed gross motor coordination impairments with affected right arm and elbow due to inhibiting pain with forearm rotation  EDEMA:   Eval:  Mildly swollen in right volar elbow  OBSERVATIONS:   Eval: Subacute/healing right distal biceps partial tear that also seems to be causing some irritation to the medial epicondyle/pronator group where the lacertus fibrosis connects.   TODAY'S TREATMENT:  05/03/24: He starts with active range of motion for exercise as well as new measures which shows improvement in 2 out of 3 ways that were checked today.  This is helpful, and he also appears nontender and better able to rotate his forearm.  Due to these things, home exercises and stretches were upgraded to be more dynamic, slightly more forceful, and now allow for eccentric strengthening activities.  These things were educated on while he is on moist heat for 3 minutes, then OT does manual therapy IASTM to the distal biceps area to alleviate irritated tissue and mild fascia in the area.  OT also does manual therapy kinesiotaping to support the bicep as this was helpful to him in the past session.  He then performs the following list of exercises and activities.  He leaves in no significant pain without exacerbation.  He was told to keep things light for the next 1 to 2 weeks still and boost his functional endurance instead   Exercises - Fridge Door Stretch  - 4 x daily - 3-5 reps - 15 sec hold - Seated Wrist Extension Stretch  - 4 x daily - 3-5 reps - 15 hold - Eccentric Bicep Strength  - 2-4 x daily - 1-2 sets - 15 reps - 10 sec hold    PATIENT EDUCATION: Education details: See tx section above for details  Person  educated: Patient Education method: Verbal Instruction, Teach back, Handouts  Education comprehension: States and demonstrates understanding, Additional Education required    HOME EXERCISE PROGRAM: Access Code: [email or web address] URL: [email or web address] Date: 04/24/2024 Prepared by: Melvenia Ada   GOALS: Goals reviewed with patient? Yes   SHORT TERM GOALS: (STG required if POC>30 days) Target Date: 05/10/2024  Pt will demo/state understanding of initial HEP to improve pain levels and prerequisite motion. Goal status: 05/03/24: MET   LONG TERM GOALS: Target Date: 06/07/2024  Pt will improve functional ability by decreased impairment per PSFS assessment from 4.8 to 6.5 or better, for better quality of life. Goal status: INITIAL  2.  Pt will improve grip strength in right hand from 105 lbs to at least 115 lbs for functional use at  home and in IADLs. Goal status: INITIAL  3.  Pt will improve A/ROM in right wrist extension from 65 degrees to at least 72 degrees, to have functional motion for tasks like reach and grasp.  Goal status: INITIAL  4.  Pt will improve strength in right forearm supination from tender 4/5 MMT to at least 4+/5 MMT to have increased functional ability to carry out selfcare and higher-level homecare tasks with less difficulty. Goal status: INITIAL  5.  Pt will decrease pain at worst from 6/10 to 2/10 or better to have better sleep and occupational participation in daily roles. Goal status: INITIAL   ASSESSMENT:  CLINICAL IMPRESSION: 05/03/24: Did very well with initial HEP, now upgraded to more dynamic stretching and eccentric strengthening activities.  If this is tolerated well, next week we will start to incorporate full arm strengthening as tolerated and possibly even discharge.   Eval: Patient is a 48 y.o. male who was seen today for occupational therapy evaluation for pain in the right elbow area and medial epicondyle after partial biceps  tear and having some mild medial epicondylitis.  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  The patient will benefit from outpatient occupational therapy to decrease symptoms, improve functional upper extremity use, and increase quality of life.    PLAN:  OT FREQUENCY: 1-2x/week  OT DURATION: 6 weeks through 06/07/24 and up to 8 total visits as needed   PLANNED INTERVENTIONS: 97535 self care/ADL training, 02889 therapeutic exercise, 97530 therapeutic activity, 97112 neuromuscular re-education, 97140 manual therapy, 97035 ultrasound, 97032 electrical stimulation (manual), 97760 Orthotic Initial, [id number] Orthotic/Prosthetic subsequent, compression bandaging, Dry needling, energy conservation, coping strategies training, and patient/family education  CONSULTED AND AGREED WITH PLAN OF CARE: Patient  PLAN FOR NEXT SESSION:   Check new eccentric bicep strengthening, add biceps, triceps, forearm, consider discharge when all goals are met   Melvenia Ada, OTR/L, CHT  05/03/2024, 12:48 PM

## 2024-05-03 ENCOUNTER — Ambulatory Visit: Admitting: Rehabilitative and Restorative Service Providers"

## 2024-05-03 ENCOUNTER — Encounter: Payer: Self-pay | Admitting: Rehabilitative and Restorative Service Providers"

## 2024-05-03 DIAGNOSIS — M6281 Muscle weakness (generalized): Secondary | ICD-10-CM

## 2024-05-03 DIAGNOSIS — R278 Other lack of coordination: Secondary | ICD-10-CM

## 2024-05-03 DIAGNOSIS — R600 Localized edema: Secondary | ICD-10-CM | POA: Diagnosis not present

## 2024-05-03 DIAGNOSIS — M25521 Pain in right elbow: Secondary | ICD-10-CM

## 2024-05-06 ENCOUNTER — Encounter: Admitting: Rehabilitative and Restorative Service Providers"

## 2024-05-07 NOTE — Therapy (Incomplete)
 OUTPATIENT OCCUPATIONAL THERAPYTREATMENT NOTE   Patient Name: Isaac Gutierrez. MRN: [id number] DOB:[birth year], 48 y.o., male Today's Date: 05/07/2024  PCP: Tanda FELIX MD REFERRING PROVIDER: Jerri Kay HERO, MD   END OF SESSION:     No past medical history on file. No past surgical history on file. Patient Active Problem List   Diagnosis Date Noted   Traumatic partial tear of biceps tendon, initial encounter 04/09/2024   Pain in right elbow 02/27/2024   Pain in joint of left shoulder 08/17/2021    ONSET DATE: DOI 02/12/24  REFERRING DIAG: D53.880J (ICD-10-CM) - Traumatic partial tear of biceps tendon, initial encounter   THERAPY DIAG:  No diagnosis found.  Rationale for Evaluation and Treatment: Rehabilitation  PERTINENT HISTORY: Nonsignificant pertinent to this injury Approx 2 months post partial distal biceps rupture now. He states after the hood of his large truck extended quickly onto his outstretched hand and elbow, he felt a tearing pain in his right medial and volar elbow.  This turned out to be partial biceps tear that took place about 2 months ago.  Since then he has felt slightly better, has been resting, but still has lingering pains with forearm rotation, especially with palm down bicep flexion.  He is having some pain with work and other regular daily activities.   PRECAUTIONS: None; RED FLAGS: None   WEIGHT BEARING RESTRICTIONS: No   SUBJECTIVE:   SUBJECTIVE STATEMENT: He states ***    doing well with stretches and exercise program, though they appear to be getting easier.  He still taking it easy in terms of no weight lifting etc.   PAIN:  Are you having pain? Yes: NPRS scale: *** 0/10 at rest up to 3/10 at worst in past week with lifting with palm down  Pain location: Right medial elbow area volarly Pain description: Aching and sometimes sharper Aggravating factors: Lifting and pronation Relieving factors: Rest and self massage   PATIENT  GOALS: To improve pain and ability with right dominant arm and elbow  NEXT MD VISIT: As needed   OBJECTIVE: (All objective assessments below are from initial evaluation on: 04/24/24 unless otherwise specified.)   HAND DOMINANCE: Right   ADLs: Overall ADLs: States decreased ability to grab, hold household objects, pain and difficulty to open containers, lift with the palm turned down   FUNCTIONAL OUTCOME MEASURES: Eval: Patient Specific Functional Scale: 4.8 (driving, jar, door knob)  (Higher Score  =  Better Ability for the Selected Tasks)      UPPER EXTREMITY ROM     Shoulder to Wrist AROM Right eval Left eval Rt 05/03/24 Rt 05/09/24  Elbow flexion 144 145    Elbow extension -5 -5    Forearm supination 88 83  ***  Forearm pronation  77 89 80 ***  Wrist flexion 80 79 86 ***  Wrist extension 65 71 63 ***  Wrist ulnar deviation      Wrist radial deviation      Functional dart thrower's motion (F-DTM) in ulnar flexion      F-DTM in radial extension       (Blank rows = not tested)   Hand AROM Right eval  Full Fist Ability (or Gap to Distal Palmar Crease) Full fist  (Blank rows = not tested)   UPPER EXTREMITY MMT:     MMT Right 04/24/24 Rt 05/09/24  Shoulder flexion    Shoulder abduction    Shoulder adduction    Shoulder extension    Shoulder internal rotation  Shoulder external rotation    Middle trapezius    Lower trapezius    Elbow flexion 4+/5 ***/5  Elbow extension 5/5 ***/5  Forearm supination 4/5 tender ***/5  Forearm pronation 4+/5 ***/5  Wrist flexion 4+/5 ***/5  Wrist extension 4+/5 ***/5  Wrist ulnar deviation    Wrist radial deviation    (Blank rows = not tested)  HAND FUNCTION: Eval: Mild  observed weakness in affected Rt hand.  Grip strength Right: 105 lbs no pain, Left: 111 lbs   COORDINATION: Eval: Observed gross motor coordination impairments with affected right arm and elbow due to inhibiting pain with forearm rotation  EDEMA:   Eval:   Mildly swollen in right volar elbow  OBSERVATIONS:   Eval: Subacute/healing right distal biceps partial tear that also seems to be causing some irritation to the medial epicondyle/pronator group where the lacertus fibrosis connects.   TODAY'S TREATMENT:  05/09/24: *** Check new eccentric bicep strengthening, add biceps, triceps, forearm, consider discharge when all goals are met    05/03/24: He starts with active range of motion for exercise as well as new measures which shows improvement in 2 out of 3 ways that were checked today.  This is helpful, and he also appears nontender and better able to rotate his forearm.  Due to these things, home exercises and stretches were upgraded to be more dynamic, slightly more forceful, and now allow for eccentric strengthening activities.  These things were educated on while he is on moist heat for 3 minutes, then OT does manual therapy IASTM to the distal biceps area to alleviate irritated tissue and mild fascia in the area.  OT also does manual therapy kinesiotaping to support the bicep as this was helpful to him in the past session.  He then performs the following list of exercises and activities.  He leaves in no significant pain without exacerbation.  He was told to keep things light for the next 1 to 2 weeks still and boost his functional endurance instead   Exercises - Fridge Door Stretch  - 4 x daily - 3-5 reps - 15 sec hold - Seated Wrist Extension Stretch  - 4 x daily - 3-5 reps - 15 hold - Eccentric Bicep Strength  - 2-4 x daily - 1-2 sets - 15 reps - 10 sec hold    PATIENT EDUCATION: Education details: See tx section above for details  Person educated: Patient Education method: Verbal Instruction, Teach back, Handouts  Education comprehension: States and demonstrates understanding, Additional Education required    HOME EXERCISE PROGRAM: Access Code: [email or web address] URL: [email or web address] Date: 04/24/2024 Prepared by:  Melvenia Ada   GOALS: Goals reviewed with patient? Yes   SHORT TERM GOALS: (STG required if POC>30 days) Target Date: 05/10/2024  Pt will demo/state understanding of initial HEP to improve pain levels and prerequisite motion. Goal status: 05/03/24: MET   LONG TERM GOALS: Target Date: 06/07/2024  Pt will improve functional ability by decreased impairment per PSFS assessment from 4.8 to 6.5 or better, for better quality of life. Goal status: INITIAL  2.  Pt will improve grip strength in right hand from 105 lbs to at least 115 lbs for functional use at home and in IADLs. Goal status: INITIAL  3.  Pt will improve A/ROM in right wrist extension from 65 degrees to at least 72 degrees, to have functional motion for tasks like reach and grasp.  Goal status: INITIAL  4.  Pt will improve strength in right forearm supination from  tender 4/5 MMT to at least 4+/5 MMT to have increased functional ability to carry out selfcare and higher-level homecare tasks with less difficulty. Goal status: INITIAL  5.  Pt will decrease pain at worst from 6/10 to 2/10 or better to have better sleep and occupational participation in daily roles. Goal status: INITIAL   ASSESSMENT:  CLINICAL IMPRESSION: 05/09/24: ***  05/03/24: Did very well with initial HEP, now upgraded to more dynamic stretching and eccentric strengthening activities.  If this is tolerated well, next week we will start to incorporate full arm strengthening as tolerated and possibly even discharge.   Eval: Patient is a 48 y.o. male who was seen today for occupational therapy evaluation for pain in the right elbow area and medial epicondyle after partial biceps tear and having some mild medial epicondylitis.  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  The patient will benefit from outpatient occupational therapy to decrease symptoms, improve functional  upper extremity use, and increase quality of life.    PLAN:  OT FREQUENCY: 1-2x/week  OT DURATION: 6 weeks through 06/07/24 and up to 8 total visits as needed   PLANNED INTERVENTIONS: 97535 self care/ADL training, 02889 therapeutic exercise, 97530 therapeutic activity, 97112 neuromuscular re-education, 97140 manual therapy, 97035 ultrasound, 97032 electrical stimulation (manual), 97760 Orthotic Initial, [id number] Orthotic/Prosthetic subsequent, compression bandaging, Dry needling, energy conservation, coping strategies training, and patient/family education  CONSULTED AND AGREED WITH PLAN OF CARE: Patient  PLAN FOR NEXT SESSION:   ***   Melvenia Ada, OTR/L, CHT  05/07/2024, 3:58 PM

## 2024-05-09 ENCOUNTER — Encounter: Admitting: Rehabilitative and Restorative Service Providers"

## 2024-05-09 NOTE — Therapy (Signed)
 OUTPATIENT OCCUPATIONAL THERAPYTREATMENT NOTE   Patient Name: Isaac Gutierrez. MRN: [id number] DOB:[birth year], 48 y.o., male Today's Date: 05/13/2024  PCP: Tanda FELIX MD REFERRING PROVIDER: Jerri Kay HERO, MD   END OF SESSION:  OT End of Session - 05/13/24 0806     Visit Number 3    Number of Visits 8    Date for OT Re-Evaluation 06/07/24    Authorization Type [employer]    OT Start Time 0806    OT Stop Time 0849    OT Time Calculation (min) 43 min    Activity Tolerance Patient tolerated treatment well;No increased pain;Patient limited by fatigue    Behavior During Therapy [facility] for tasks assessed/performed            History reviewed. No pertinent past medical history. History reviewed. No pertinent surgical history. Patient Active Problem List   Diagnosis Date Noted   Traumatic partial tear of biceps tendon, initial encounter 04/09/2024   Pain in right elbow 02/27/2024   Pain in joint of left shoulder 08/17/2021    ONSET DATE: DOI 02/12/24  REFERRING DIAG: D53.880J (ICD-10-CM) - Traumatic partial tear of biceps tendon, initial encounter   THERAPY DIAG:  Muscle weakness (generalized)  Other lack of coordination  Pain in right elbow  Localized edema  Rationale for Evaluation and Treatment: Rehabilitation  PERTINENT HISTORY: Nonsignificant pertinent to this injury Approx 2 months post partial distal biceps rupture now. He states after the hood of his large truck extended quickly onto his outstretched hand and elbow, he felt a tearing pain in his right medial and volar elbow.  This turned out to be partial biceps tear that took place about 2 months ago.  Since then he has felt slightly better, has been resting, but still has lingering pains with forearm rotation, especially with palm down bicep flexion.  He is having some pain with work and other regular daily activities.   PRECAUTIONS: None; RED FLAGS: None   WEIGHT BEARING RESTRICTIONS: No   SUBJECTIVE:    SUBJECTIVE STATEMENT: He states using a drill at home that kicked back and hurt him for an hour or 2, but this has resolved now.  He was told to use caution with heavy tasks at home for the next 2 weeks still.     PAIN:  Are you having pain? Yes: NPRS scale:  0/10 at rest now Pain location: Right medial elbow area volarly Pain description: Aching and sometimes sharper Aggravating factors: Lifting and pronation Relieving factors: Rest and self massage   PATIENT GOALS: To improve pain and ability with right dominant arm and elbow  NEXT MD VISIT: As needed   OBJECTIVE: (All objective assessments below are from initial evaluation on: 04/24/24 unless otherwise specified.)   HAND DOMINANCE: Right   ADLs: Overall ADLs: States decreased ability to grab, hold household objects, pain and difficulty to open containers, lift with the palm turned down   FUNCTIONAL OUTCOME MEASURES: Eval: Patient Specific Functional Scale: 4.8 (driving, jar, door knob)  (Higher Score  =  Better Ability for the Selected Tasks)      UPPER EXTREMITY ROM     Shoulder to Wrist AROM Right eval Left eval Rt 05/03/24 Rt 05/13/24  Elbow flexion 144 145    Elbow extension -5 -5    Forearm supination 88 83  82  Forearm pronation  77 89 80 81  Wrist flexion 80 79 86 77  Wrist extension 65 71 63 65  Wrist ulnar deviation  Wrist radial deviation      Functional dart thrower's motion (F-DTM) in ulnar flexion      F-DTM in radial extension       (Blank rows = not tested)   Hand AROM Right eval  Full Fist Ability (or Gap to Distal Palmar Crease) Full fist  (Blank rows = not tested)   UPPER EXTREMITY MMT:     MMT Right 04/24/24 Rt 05/13/24  Shoulder flexion    Shoulder abduction    Shoulder adduction    Shoulder extension    Shoulder internal rotation    Shoulder external rotation    Middle trapezius    Lower trapezius    Elbow flexion 4+/5 5/5  Elbow extension 5/5 5/5  Forearm supination 4/5  tender 4+/5  Forearm pronation 4+/5 5/5  Wrist flexion 4+/5 5/5  Wrist extension 4+/5 4+/5  Wrist ulnar deviation    Wrist radial deviation    (Blank rows = not tested)  HAND FUNCTION: Eval: Mild  observed weakness in affected Rt hand.  Grip strength Right: 105 lbs no pain, Left: 111 lbs   COORDINATION: Eval: Observed gross motor coordination impairments with affected right arm and elbow due to inhibiting pain with forearm rotation  EDEMA:   Eval:  Mildly swollen in right volar elbow  OBSERVATIONS:   Eval: Subacute/healing right distal biceps partial tear that also seems to be causing some irritation to the medial epicondyle/pronator group where the lacertus fibrosis connects.   TODAY'S TREATMENT:  05/13/24: He starts with active range of motion for new measures as well as exercise which shows continued tightness through the wrist and forearm but at least he has not exacerbated this by using the drill at home.  While on moist heat we reviewed his exercises and OT educates on new strengthening as bolded below.  When OT does manual therapy precaution which he greatly enjoys and states lowers the tension in his arm.  Next he performs his stretches followed by new dynamic and stabilizing exercises as bolded below.  He tolerates these well and is told to do nothing strenuous or painful for the next 2 weeks still.    Exercises - Fridge Door Stretch  - 4 x daily - 3-5 reps - 15 sec hold - Seated Wrist Extension Stretch  - 4 x daily - 3-5 reps - 15 hold - Eccentric Bicep Strength  - 2-4 x daily - 1-2 sets - 15 reps - 10 sec hold (now tolerates 5#, also given blue band)  - Hammer Stretch or Strength   - 2-4 x daily - 1-2 sets - 10-15 reps   (uses 1# hammer today)  - Seated Eccentric Wrist Extension  - 4-6 x daily - 1 sets - 10-15 reps (uses **# today)  - Isometric Wrist Extension Pronated  - 2-3 x daily - 4-5 x weekly - 5 reps - 5-10 seconds hold     PATIENT EDUCATION: Education details:  See tx section above for details  Person educated: Patient Education method: Verbal Instruction, Teach back, Handouts  Education comprehension: States and demonstrates understanding, Additional Education required    HOME EXERCISE PROGRAM: Access Code: [email or web address] URL: [email or web address] Date: 04/24/2024 Prepared by: Melvenia Ada   GOALS: Goals reviewed with patient? Yes   SHORT TERM GOALS: (STG required if POC>30 days) Target Date: 05/10/2024  Pt will demo/state understanding of initial HEP to improve pain levels and prerequisite motion. Goal status: 05/03/24: MET   LONG TERM GOALS: Target Date: 06/07/2024  Pt will  improve functional ability by decreased impairment per PSFS assessment from 4.8 to 6.5 or better, for better quality of life. Goal status: INITIAL  2.  Pt will improve grip strength in right hand from 105 lbs to at least 115 lbs for functional use at home and in IADLs. Goal status: INITIAL  3.  Pt will improve A/ROM in right wrist extension from 65 degrees to at least 72 degrees, to have functional motion for tasks like reach and grasp.  Goal status: INITIAL  4.  Pt will improve strength in right forearm supination from tender 4/5 MMT to at least 4+/5 MMT to have increased functional ability to carry out selfcare and higher-level homecare tasks with less difficulty. Goal status: INITIAL  5.  Pt will decrease pain at worst from 6/10 to 2/10 or better to have better sleep and occupational participation in daily roles. Goal status: INITIAL   ASSESSMENT:  CLINICAL IMPRESSION: 05/13/24: Continue on and hopefully he does not have exacerbation again  05/03/24: Did very well with initial HEP, now upgraded to more dynamic stretching and eccentric strengthening activities.  If this is tolerated well, next week we will start to incorporate full arm strengthening as tolerated and possibly even discharge.   Eval: Patient is a 48 y.o. male who was seen  today for occupational therapy evaluation for pain in the right elbow area and medial epicondyle after partial biceps tear and having some mild medial epicondylitis.  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  The patient will benefit from outpatient occupational therapy to decrease symptoms, improve functional upper extremity use, and increase quality of life.    PLAN:  OT FREQUENCY: 1-2x/week  OT DURATION: 6 weeks through 06/07/24 and up to 8 total visits as needed   PLANNED INTERVENTIONS: 97535 self care/ADL training, 02889 therapeutic exercise, 97530 therapeutic activity, 97112 neuromuscular re-education, 97140 manual therapy, 97035 ultrasound, 97032 electrical stimulation (manual), 97760 Orthotic Initial, [id number] Orthotic/Prosthetic subsequent, compression bandaging, Dry needling, energy conservation, coping strategies training, and patient/family education  CONSULTED AND AGREED WITH PLAN OF CARE: Patient  PLAN FOR NEXT SESSION:   Check knee strengthening activities and progress as tolerated   Melvenia Ada, OTR/L, CHT  05/13/2024, 8:48 AM

## 2024-05-13 ENCOUNTER — Ambulatory Visit (INDEPENDENT_AMBULATORY_CARE_PROVIDER_SITE_OTHER): Admitting: Rehabilitative and Restorative Service Providers"

## 2024-05-13 ENCOUNTER — Encounter: Payer: Self-pay | Admitting: Rehabilitative and Restorative Service Providers"

## 2024-05-13 DIAGNOSIS — R278 Other lack of coordination: Secondary | ICD-10-CM | POA: Diagnosis not present

## 2024-05-13 DIAGNOSIS — R600 Localized edema: Secondary | ICD-10-CM | POA: Diagnosis not present

## 2024-05-13 DIAGNOSIS — M25521 Pain in right elbow: Secondary | ICD-10-CM

## 2024-05-13 DIAGNOSIS — M6281 Muscle weakness (generalized): Secondary | ICD-10-CM

## 2024-05-16 NOTE — Therapy (Signed)
 OUTPATIENT OCCUPATIONAL THERAPYTREATMENT & DISCHARGE NOTE   Patient Name: Isaac Gutierrez. MRN: [id number] DOB:10/07/1976, 48 y.o., male Today's Date: 05/20/2024  PCP: Tanda FELIX MD REFERRING PROVIDER: Jerri Kay HERO, MD              OCCUPATIONAL THERAPY DISCHARGE SUMMARY  Visits from Start of Care: 4  Current functional level related to goals / functional outcomes: Pt has met all goals to satisfactory levels and is pleased with outcomes.   Remaining deficits: Pt has no more significant functional deficits or pain.   Education / Equipment: Pt has all needed materials and education. Pt understands how to continue on with self-management. See tx notes for more details.   Patient agrees to discharge due to max benefits received from outpatient occupational therapy / hand therapy at this time.   Melvenia Ada, OTR/L, CHT 05/20/24             END OF SESSION:  OT End of Session - 05/20/24 0802     Visit Number 4    Number of Visits 8    Date for OT Re-Evaluation 06/07/24    Authorization Type [employer]    OT Start Time 0802    OT Stop Time 0845    OT Time Calculation (min) 43 min    Activity Tolerance Patient tolerated treatment well;No increased pain;Patient limited by fatigue    Behavior During Therapy [facility] for tasks assessed/performed             History reviewed. No pertinent past medical history. History reviewed. No pertinent surgical history. Patient Active Problem List   Diagnosis Date Noted   Traumatic partial tear of biceps tendon, initial encounter 04/09/2024   Pain in right elbow 02/27/2024   Pain in joint of left shoulder 08/17/2021    ONSET DATE: DOI 02/12/24  REFERRING DIAG: D53.880J (ICD-10-CM) - Traumatic partial tear of biceps tendon, initial encounter   THERAPY DIAG:  Muscle weakness (generalized)  Other lack of coordination  Localized edema  Pain in right elbow  Rationale for Evaluation and Treatment:  Rehabilitation  PERTINENT HISTORY: Nonsignificant pertinent to this injury Approx 2 months post partial distal biceps rupture now. He states after the hood of his large truck extended quickly onto his outstretched hand and elbow, he felt a tearing pain in his right medial and volar elbow.  This turned out to be partial biceps tear that took place about 2 months ago.  Since then he has felt slightly better, has been resting, but still has lingering pains with forearm rotation, especially with palm down bicep flexion.  He is having some pain with work and other regular daily activities.   PRECAUTIONS: None; RED FLAGS: None   WEIGHT BEARING RESTRICTIONS: No   SUBJECTIVE:   SUBJECTIVE STATEMENT: He states strengthening going well, no pain or exacerbation in the past week.     PAIN:  Are you having pain? NO NPRS scale:  0/10 at rest now or in past week    PATIENT GOALS: To improve pain and ability with right dominant arm and elbow  NEXT MD VISIT: As needed   OBJECTIVE: (All objective assessments below are from initial evaluation on: 04/24/24 unless otherwise specified.)   HAND DOMINANCE: Right   ADLs: Overall ADLs: States decreased ability to grab, hold household objects, pain and difficulty to open containers, lift with the palm turned down   FUNCTIONAL OUTCOME MEASURES: 05/20/24: PSFS: 9.6  Eval: Patient Specific Functional Scale: 4.8 (driving, jar, door knob)  (Higher  Score  =  Better Ability for the Selected Tasks)      UPPER EXTREMITY ROM     Shoulder to Wrist AROM Right eval Left eval Rt 05/03/24 Rt 05/13/24 Rt 05/20/24  Elbow flexion 144 145     Elbow extension -5 -5     Forearm supination 88 83  82 88  Forearm pronation  77 89 80 81 84  Wrist flexion 80 79 86 77 80  Wrist extension 65 71 63 65 70  Wrist ulnar deviation       Wrist radial deviation       Functional dart thrower's motion (F-DTM) in ulnar flexion       F-DTM in radial extension        (Blank rows =  not tested)   Hand AROM Right eval  Full Fist Ability (or Gap to Distal Palmar Crease) Full fist  (Blank rows = not tested)   UPPER EXTREMITY MMT:     MMT Right 04/24/24 Rt 05/13/24 Rt 05/20/24  Elbow flexion 4+/5 5/5 5/5   Elbow extension 5/5 5/5 5/5   Forearm supination 4/5 tender 4+/5 5/5   Forearm pronation 4+/5 5/5 5/5   Wrist flexion 4+/5 5/5 5/5   Wrist extension 4+/5 4+/5 5/5   (Blank rows = not tested)  HAND FUNCTION: Eval: Mild  observed weakness in affected Rt hand.  Grip strength Right: 105 lbs no pain, Left: 111 lbs   COORDINATION: Eval: Observed gross motor coordination impairments with affected right arm and elbow due to inhibiting pain with forearm rotation  EDEMA:   Eval:  Mildly swollen in right volar elbow  OBSERVATIONS:   Eval: Subacute/healing right distal biceps partial tear that also seems to be causing some irritation to the medial epicondyle/pronator group where the lacertus fibrosis connects.   TODAY'S TREATMENT:  05/20/24:  Pt performs AROM, gripping, and strength with Rt arm against therapist's resistance for exercise/activities as well as new measures today. OT also discusses home and functional tasks with the pt and reviews goals. Using the complied data, OT also reviews home exercises and provides updated recommendations and upgrades as below. Pt states understanding and tolerates upgrades well.      Exercises Reviewed/performed:  - Designer, industrial/product  - 4 x daily - 3-5 reps - 15 sec hold - Seated Wrist Extension Stretch  - 4 x daily - 3-5 reps - 15 hold - Eccentric Bicep Strength  - 2-4 x daily - 1-2 sets - 15 reps - 10 sec hold (now tolerates 5#, also given blue band)  - Hammer Stretch or Strength   - 2-4 x daily - 1-2 sets - 10-15 reps   (uses 1# hammer today)  - Seated Eccentric Wrist Extension  - 4-6 x daily - 1 sets - 10-15 reps (uses 3# today)  - Isometric Wrist Extension Pronated  - 2-3 x daily - 4-5 x weekly - 5 reps - 5-10 seconds  hold    Added bicep curls: 20# x 10  Tricep presses: 35# x10  Bicep rope with FA twist 15# x 15  Tricep rope with FA twist 25# x15      PATIENT EDUCATION: Education details: See tx section above for details  Person educated: Patient Education method: Verbal Instruction, Teach back, Handouts  Education comprehension: States and demonstrates understanding, Additional Education required    HOME EXERCISE PROGRAM: Access Code: [email or web address] URL: [email or web address] Date: 04/24/2024 Prepared by: Melvenia Ada   GOALS: Goals reviewed with patient? Yes  SHORT TERM GOALS: (STG required if POC>30 days) Target Date: 05/10/2024  Pt will demo/state understanding of initial HEP to improve pain levels and prerequisite motion. Goal status: 05/03/24: MET   LONG TERM GOALS: Target Date: 06/07/2024  Pt will improve functional ability by decreased impairment per PSFS assessment from 4.8 to 6.5 or better, for better quality of life. Goal status: 05/20/24: MET  2.  Pt will improve grip strength in right hand from 105 lbs to at least 115 lbs for functional use at home and in IADLs. Goal status: 05/20/24: MET 125#   3.  Pt will improve A/ROM in right wrist extension from 65 degrees to at least 72 degrees, to have functional motion for tasks like reach and grasp.  Goal status: 05/20/24: MET 70* but considered met   4.  Pt will improve strength in right forearm supination from tender 4/5 MMT to at least 4+/5 MMT to have increased functional ability to carry out selfcare and higher-level homecare tasks with less difficulty. Goal status: 05/20/24: MET  5.  Pt will decrease pain at worst from 6/10 to 2/10 or better to have better sleep and occupational participation in daily roles. Goal status: 05/20/24: MET   ASSESSMENT:  CLINICAL IMPRESSION: 05/20/24: All goals met today, he's going to be careful for about 2 more weeks, keep stretches and progressively strengthening and return to full.       PLAN:  OT FREQUENCY: D/C   OT DURATION: D/C   PLANNED INTERVENTIONS: 97535 self care/ADL training, 02889 therapeutic exercise, 97530 therapeutic activity, 97112 neuromuscular re-education, 97140 manual therapy, 97035 ultrasound, [id number] electrical stimulation (manual), [id number] Orthotic Initial, [id number] Orthotic/Prosthetic subsequent, compression bandaging, Dry needling, energy conservation, coping strategies training, and patient/family education  CONSULTED AND AGREED WITH PLAN OF CARE: Patient  PLAN FOR NEXT SESSION:   N/A, D/C   Melvenia Ada, OTR/L, CHT  05/20/2024, 8:49 AM

## 2024-05-20 ENCOUNTER — Ambulatory Visit: Admitting: Rehabilitative and Restorative Service Providers"

## 2024-05-20 ENCOUNTER — Encounter: Payer: Self-pay | Admitting: Rehabilitative and Restorative Service Providers"

## 2024-05-20 DIAGNOSIS — R600 Localized edema: Secondary | ICD-10-CM

## 2024-05-20 DIAGNOSIS — R278 Other lack of coordination: Secondary | ICD-10-CM

## 2024-05-20 DIAGNOSIS — M25521 Pain in right elbow: Secondary | ICD-10-CM

## 2024-05-20 DIAGNOSIS — M6281 Muscle weakness (generalized): Secondary | ICD-10-CM | POA: Diagnosis not present

## 2024-05-21 ENCOUNTER — Ambulatory Visit: Admitting: Orthopaedic Surgery

## 2024-05-21 DIAGNOSIS — S46119A Strain of muscle, fascia and tendon of long head of biceps, unspecified arm, initial encounter: Secondary | ICD-10-CM

## 2024-05-21 NOTE — Progress Notes (Signed)
     Patient: Isaac Kilpatrick Jr.           Date of Birth: 05-25-1976           MRN: [id number] Visit Date: 05/21/2024 PCP: Tanda Bleacher, MD   Assessment & Plan:  Chief Complaint:  Chief Complaint  Patient presents with   Right Elbow - Follow-up    MRI elbow   Visit Diagnoses:  1. Traumatic partial tear of biceps tendon, initial encounter     Plan: History of Present Illness Lora Glomski. is a 48 year old male who presents for follow-up after a right biceps tear.  He experienced a right biceps tear on April 28th and is now more than three months post-injury. He reports significant improvement in his right arm and has been released from therapy as of yesterday. Pain is non-existent. He has been cautious with his arm but does not feel he has lost any strength. He is confident in his recovery but remains cautious about resuming activities.  Physical Exam MUSCULOSKELETAL: Full elbow extension and flexion. No tenderness over biceps.  Assessment and Plan Right biceps tendon tear, status post recovery Healed with full strength and range of motion. Cleared for activities. - Released from therapy. - Resume activities as tolerated. - No further work note needed.  Follow-Up Instructions: No follow-ups on file.   Orders:  No orders of the defined types were placed in this encounter.  No orders of the defined types were placed in this encounter.   Imaging: No results found.  PMFS History: Patient Active Problem List   Diagnosis Date Noted   Traumatic partial tear of biceps tendon, initial encounter 04/09/2024   Pain in right elbow 02/27/2024   Pain in joint of left shoulder 08/17/2021   No past medical history on file.  Family History  Problem Relation Age of Onset   Alcohol abuse Father    Diabetes Sister     No past surgical history on file. Social History   Occupational History   Not on file  Tobacco Use   Smoking status: Never   Smokeless tobacco:  Never  Vaping Use   Vaping status: Never Used  Substance and Sexual Activity   Alcohol use: No   Drug use: No   Sexual activity: Yes    Birth control/protection: None

## 2024-08-19 ENCOUNTER — Encounter: Payer: Self-pay | Admitting: Radiology
